# Patient Record
Sex: Female | Born: 2005 | Race: Black or African American | Hispanic: No | Marital: Single | Smoking: Never smoker
Health system: Southern US, Community
[De-identification: ages and names within clinical notes are randomized; demographics above are authoritative.]

## PROBLEM LIST (undated history)

## (undated) DIAGNOSIS — R569 Unspecified convulsions: Secondary | ICD-10-CM

## (undated) HISTORY — PX: EYE SURGERY: SHX253

---
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
## 2024-01-10 ENCOUNTER — Ambulatory Visit: Payer: Medicaid Other | Admitting: Nurse Practitioner

## 2024-02-06 ENCOUNTER — Ambulatory Visit (INDEPENDENT_AMBULATORY_CARE_PROVIDER_SITE_OTHER): Payer: Medicaid Other | Admitting: Certified Nurse Midwife

## 2024-02-06 ENCOUNTER — Encounter: Payer: Self-pay | Admitting: Certified Nurse Midwife

## 2024-02-06 ENCOUNTER — Other Ambulatory Visit (HOSPITAL_COMMUNITY)
Admission: RE | Admit: 2024-02-06 | Discharge: 2024-02-06 | Disposition: A | Discharge status: Home / Self Care | Source: Ambulatory Visit | Attending: Certified Nurse Midwife | Admitting: Certified Nurse Midwife

## 2024-02-06 VITALS — BP 104/66 | HR 80 | Ht 59.0 in | Wt 96.0 lb

## 2024-02-06 DIAGNOSIS — Z113 Encounter for screening for infections with a predominantly sexual mode of transmission: Secondary | ICD-10-CM

## 2024-02-06 DIAGNOSIS — Z7689 Persons encountering health services in other specified circumstances: Secondary | ICD-10-CM | POA: Diagnosis not present

## 2024-02-06 DIAGNOSIS — N941 Unspecified dyspareunia: Secondary | ICD-10-CM

## 2024-02-06 NOTE — Progress Notes (Signed)
 Stanford Breed, MD   Chief Complaint  Patient presents with   New Patient (Initial Visit)    HPI:      Tammy Cross is a 18 y.o. [id number] whose LMP was Patient's last menstrual period was 11/06/2023 (exact date)., presents today for painful intercourse with vaginal dryness. She has one female partner, they are monogamous. Nexplanon for contraception, placed in 2022.  She reports difficulty achieving orgasm, pain and abdominal pressure with penetration.  There are no active problems to display for this patient.   Past Surgical History:  Procedure Laterality Date   EYE SURGERY      History reviewed. No pertinent family history.  Social History   Socioeconomic History   Marital status: Single    Spouse name: Not on file   Number of children: Not on file   Years of education: Not on file   Highest education level: 12th grade  Occupational History   Not on file  Tobacco Use   Smoking status: Never   Smokeless tobacco: Never  Vaping Use   Vaping status: Never Used  Substance and Sexual Activity   Alcohol use: Not Currently   Drug use: Never   Sexual activity: Yes  Other Topics Concern   Not on file  Social History Narrative   Not on file   Social Drivers of Health   Financial Resource Strain: Low Risk  (01/10/2024)   Overall Financial Resource Strain (CARDIA)    Difficulty of Paying Living Expenses: Not hard at all  Food Insecurity: No Food Insecurity (01/10/2024)   Hunger Vital Sign    Worried About Running Out of Food in the Last Year: Never true    Ran Out of Food in the Last Year: Never true  Transportation Needs: No Transportation Needs (01/10/2024)   PRAPARE - Administrator, Civil Service (Medical): No    Lack of Transportation (Non-Medical): No  Physical Activity: Unknown (01/10/2024)   Exercise Vital Sign    Days of Exercise per Week: 0 days    Minutes of Exercise per Session: Not on file  Stress: Stress Concern Present (01/10/2024)    [employer] of Occupational Health - Occupational Stress Questionnaire    Feeling of Stress : Very much  Social Connections: Moderately Isolated (01/10/2024)   Social Connection and Isolation Panel [NHANES]    Frequency of Communication with Friends and Family: More than three times a week    Frequency of Social Gatherings with Friends and Family: Once a week    Attends Religious Services: More than 4 times per year    Active Member of [employer] or Organizations: No    Attends Engineer, structural: Not on file    Marital Status: Never married  Intimate Partner Violence: Not on file    Outpatient Medications Prior to Visit  Medication Sig Dispense Refill   etonogestrel (NEXPLANON) 68 MG IMPL implant Inject into the skin.     cyclobenzaprine (FLEXERIL) 5 MG tablet Take 1 tablet (5 mg total) by mouth 3 (three) times daily as needed for muscle spasms. 30 tablet 0   chlorhexidine (PERIDEX) 0.12 % solution Use as directed 15 mLs in the mouth or throat 2 (two) times daily. 120 mL 0   ibuprofen (ADVIL) 800 MG tablet Take 1 tablet (800 mg total) by mouth every 8 (eight) hours as needed. 30 tablet 0   No facility-administered medications prior to visit.      ROS:  Review of Systems  Constitutional:  Negative.   Respiratory: Negative.    Cardiovascular: Negative.   Genitourinary:  Positive for dyspareunia, pelvic pain, vaginal discharge and vaginal pain.     OBJECTIVE:   Vitals:  BP 104/66   Pulse 80   Ht 4\' 11"  (1.499 m)   Wt 96 lb (43.5 kg)   LMP 11/06/2023 (Exact Date)   BMI 19.39 kg/m   Physical Exam Constitutional:      Appearance: Normal appearance.  Cardiovascular:     Rate and Rhythm: Normal rate.  Pulmonary:     Effort: Pulmonary effort is normal.  Abdominal:     General: Abdomen is flat.     Palpations: Abdomen is soft.  Genitourinary:    General: Normal vulva.     Tanner stage (genital): 5.     Vagina: Vaginal discharge present.     Cervix: Normal.  No cervical motion tenderness.     Adnexa:        Right: No mass or tenderness.         Left: No mass or tenderness.       Comments: Cervix anterior, uterus retroverted to retroflexed. Neurological:     General: No focal deficit present.     Mental Status: She is alert and oriented to person, place, and time.  Psychiatric:        Mood and Affect: Mood normal.        Behavior: Behavior normal.    Results: No results found for this or any previous visit (from the past 24 hours).   Assessment/Plan: Encounter to establish care  Screening examination for STI - Plan: Cervicovaginal ancillary only  Dyspareunia in female  Discussed use of lubrication, different positions to manage depth with IC, reviewed that position of uterus may be impacting discomfort. Recommend removal of Nexplanon by or before 01/2026, STI prevention with condoms. Will notify via MyChart of results.  No orders of the defined types were placed in this encounter.    Dominica Severin, CNM 02/06/2024 12:20 PM

## 2024-02-07 LAB — CERVICOVAGINAL ANCILLARY ONLY
Bacterial Vaginitis (gardnerella): NEGATIVE
Candida Glabrata: NEGATIVE
Candida Vaginitis: POSITIVE — AB
Chlamydia: POSITIVE — AB
Comment: NEGATIVE
Comment: NEGATIVE
Comment: NEGATIVE
Comment: NEGATIVE
Comment: NEGATIVE
Comment: NORMAL
Neisseria Gonorrhea: NEGATIVE
Trichomonas: NEGATIVE

## 2024-02-08 ENCOUNTER — Other Ambulatory Visit: Payer: Self-pay | Admitting: Certified Nurse Midwife

## 2024-02-08 ENCOUNTER — Encounter: Payer: Self-pay | Admitting: Certified Nurse Midwife

## 2024-02-08 MED ORDER — DOXYCYCLINE HYCLATE 100 MG PO CAPS: 100.0000 mg | ORAL_CAPSULE | Freq: Two times a day (BID) | ORAL | 0 refills | Status: AC

## 2024-02-08 MED ORDER — FLUCONAZOLE 150 MG PO TABS: 150.0000 mg | ORAL_TABLET | ORAL | 0 refills | Status: AC

## 2024-02-28 ENCOUNTER — Ambulatory Visit: Payer: Medicaid Other | Admitting: Nurse Practitioner

## 2024-04-03 DIAGNOSIS — Z743 Need for continuous supervision: Secondary | ICD-10-CM | POA: Diagnosis not present

## 2024-04-03 DIAGNOSIS — R569 Unspecified convulsions: Secondary | ICD-10-CM | POA: Diagnosis not present

## 2024-04-03 DIAGNOSIS — G4089 Other seizures: Secondary | ICD-10-CM | POA: Diagnosis not present

## 2024-04-03 DIAGNOSIS — Z5982 Transportation insecurity: Secondary | ICD-10-CM | POA: Diagnosis not present

## 2024-04-04 DIAGNOSIS — R569 Unspecified convulsions: Secondary | ICD-10-CM | POA: Diagnosis not present

## 2024-04-04 DIAGNOSIS — E872 Acidosis, unspecified: Secondary | ICD-10-CM | POA: Diagnosis not present

## 2024-04-05 ENCOUNTER — Ambulatory Visit: Payer: Medicaid Other | Admitting: Nurse Practitioner

## 2024-04-05 DIAGNOSIS — R569 Unspecified convulsions: Secondary | ICD-10-CM | POA: Diagnosis not present

## 2024-05-12 ENCOUNTER — Telehealth: Admitting: Family Medicine

## 2024-05-12 DIAGNOSIS — N898 Other specified noninflammatory disorders of vagina: Secondary | ICD-10-CM

## 2024-05-12 DIAGNOSIS — N941 Unspecified dyspareunia: Secondary | ICD-10-CM

## 2024-05-12 NOTE — Progress Notes (Signed)
  Because Ms. Wanat, I feel your condition warrants further evaluation and I recommend that you be seen in a face-to-face visit.   NOTE: There will be NO CHARGE for this E-Visit   If you are having a true medical emergency, please call 911.     For an urgent face to face visit, [facility] has multiple urgent care centers for your convenience.  Click the link below for the full list of locations and hours, walk-in wait times, appointment scheduling options and driving directions:  Urgent Care - [place], [place], [place], [place], [place], [place]  [place]     Your MyChart E-visit questionnaire answers were reviewed by a board certified advanced clinical practitioner to complete your personal care plan based on your specific symptoms.    Thank you for using e-Visits.   have provided 5 minutes of non face to face time during this encounter for chart review and documentation.

## 2024-05-14 ENCOUNTER — Ambulatory Visit: Payer: Self-pay

## 2024-05-17 ENCOUNTER — Ambulatory Visit

## 2024-07-23 ENCOUNTER — Emergency Department

## 2024-07-23 ENCOUNTER — Emergency Department
Admission: EM | Admit: 2024-07-23 | Discharge: 2024-07-23 | Disposition: A | Discharge status: Home / Self Care | Attending: Emergency Medicine | Admitting: Emergency Medicine

## 2024-07-23 ENCOUNTER — Other Ambulatory Visit: Payer: Self-pay

## 2024-07-23 DIAGNOSIS — W231XXA Caught, crushed, jammed, or pinched between stationary objects, initial encounter: Secondary | ICD-10-CM | POA: Insufficient documentation

## 2024-07-23 DIAGNOSIS — Y92810 Car as the place of occurrence of the external cause: Secondary | ICD-10-CM | POA: Diagnosis not present

## 2024-07-23 DIAGNOSIS — S6010XA Contusion of unspecified finger with damage to nail, initial encounter: Secondary | ICD-10-CM

## 2024-07-23 DIAGNOSIS — S60111A Contusion of right thumb with damage to nail, initial encounter: Secondary | ICD-10-CM | POA: Insufficient documentation

## 2024-07-23 DIAGNOSIS — S6991XA Unspecified injury of right wrist, hand and finger(s), initial encounter: Secondary | ICD-10-CM | POA: Diagnosis not present
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
## 2024-07-23 NOTE — ED Provider Notes (Signed)
 [facility] Provider Note    Event Date/Time   First MD Initiated Contact with Patient 07/23/24 1107     (approximate)   History   Finger Injury   HPI  Tammy Cross is a 18 y.o. female who presents today for evaluation of injury to her right thumb.  Patient reports that 2 days ago she slammed her thumb in the car door and has had pain ever since.  She denies any open wounds or bleeding.  No numbness or tingling.  She is still able to move her thumb normally  There are no active problems to display for this patient.         Physical Exam   Triage Vital Signs: ED Triage Vitals  Encounter Vitals Group     BP 07/23/24 1103 (!) 124/58     Girls Systolic BP Percentile --      Girls Diastolic BP Percentile --      Boys Systolic BP Percentile --      Boys Diastolic BP Percentile --      Pulse Rate 07/23/24 1103 60     Resp 07/23/24 1103 18     Temp 07/23/24 1103 98 F (36.7 C)     Temp Source 07/23/24 1103 Oral     SpO2 07/23/24 1103 98 %     Weight 07/23/24 1101 95 lb (43.1 kg)     Height 07/23/24 1101 4' 11 (1.499 m)     Head Circumference --      Peak Flow --      Pain Score 07/23/24 1101 10     Pain Loc --      Pain Education --      Exclude from Growth Chart --     Most recent vital signs: Vitals:   07/23/24 1103 07/23/24 1134  BP: (!) 124/58 122/60  Pulse: 60 62  Resp: 18 18  Temp: 98 F (36.7 C) 98 F (36.7 C)  SpO2: 98% 98%    Physical Exam Vitals and nursing note reviewed.  Constitutional:      General: Awake and alert. No acute distress.    Appearance: Normal appearance. The patient is normal weight.  HENT:     Head: Normocephalic and atraumatic.     Mouth: Mucous membranes are moist.  Eyes:     General: PERRL. Normal EOMs        Right eye: No discharge.        Left eye: No discharge.     Conjunctiva/sclera: Conjunctivae normal.  Cardiovascular:     Rate and Rhythm: Normal rate and regular rhythm.     Pulses:  Normal pulses.  Pulmonary:     Effort: Pulmonary effort is normal. No respiratory distress.     Breath sounds: Normal breath sounds.  Abdominal:     Abdomen is soft. There is no abdominal tenderness. No rebound or guarding. No distention. Musculoskeletal:        General: No swelling. Normal range of motion.     Cervical back: Normal range of motion and neck supple.  Right thumb with subungual hematoma noted extending from base of nail to mid nail.  There is no swelling to the rest of the thumb or pad of the thumb.  She has normal range of motion of her thumb including abduction and adduction against resistance, and flexion and extension at isolated MCP and DIP.  No open wounds. Skin:    General: Skin is warm and dry.  Capillary Refill: Capillary refill takes less than 2 seconds.     Findings: No rash.  Neurological:     Mental Status: The patient is awake and alert.      ED Results / Procedures / Treatments   Labs (all labs ordered are listed, but only abnormal results are displayed) Labs Reviewed - No data to display   EKG     RADIOLOGY I independently reviewed and interpreted imaging and agree with radiologists findings.     PROCEDURES:  Critical Care performed:   .Incision and Drainage  Date/Time: 07/23/2024 2:52 PM  Performed by: Andreyah Natividad E, PA-C Authorized by: Nataya Bastedo E, PA-C   Consent:    Consent obtained:  Verbal   Consent given by:  Patient   Risks, benefits, and alternatives were discussed: yes     Risks discussed:  Bleeding, damage to other organs, incomplete drainage, infection and pain (Hole in the nail)   Alternatives discussed:  No treatment Universal protocol:    Procedure explained and questions answered to patient or proxy's satisfaction: yes     Relevant documents present and verified: yes     Test results available : yes     Imaging studies available: yes     Required blood products, implants, devices, and special equipment  available: yes     Site/side marked: yes     Immediately prior to procedure, a time out was called: yes     Patient identity confirmed:  Verbally with patient Location:    Type:  Subungual hematoma   Size:  0.5   Location:  Upper extremity   Upper extremity location:  Finger   Finger location:  R thumb Pre-procedure details:    Skin preparation:  Antiseptic wash Sedation:    Sedation type:  None Anesthesia:    Anesthesia method:  None Procedure type:    Complexity:  Simple Procedure details:    Ultrasound guidance: no     Needle aspiration: no     Incision types:  Stab incision (Trephination with cautery pen)   Incision depth:  Subungual   Drainage:  Bloody   Drainage amount:  Moderate   Wound treatment:  Wound left open   Packing materials:  None Post-procedure details:    Procedure completion:  Tolerated well, no immediate complications    MEDICATIONS ORDERED IN ED: Medications - No data to display   IMPRESSION / MDM / ASSESSMENT AND PLAN / ED COURSE  I reviewed the triage vital signs and the nursing notes.   Differential diagnosis includes, but is not limited to, subungual hematoma, fracture, less likely paronychia  Patient is awake and alert, hemodynamically stable and afebrile.  She has obvious subungual hematoma.  X-ray obtained reveals no acute fracture or dislocation.  I discussed the option of trephination to relieve the pressure which is likely contributing to her pain, and patient would like to proceed with this.  Trephination was performed with moderate amount of blood released and significant improvement of her pain.  We discussed wound care and return precautions.  Patient understands and agrees with plan.  She was discharged in stable condition.   Patient's presentation is most consistent with acute complicated illness / injury requiring diagnostic workup.    FINAL CLINICAL IMPRESSION(S) / ED DIAGNOSES   Final diagnoses:  Subungual hematoma of digit  of hand, initial encounter     Rx / DC Orders   ED Discharge Orders     None  Note:  This document was prepared using Dragon voice recognition software and may include unintentional dictation errors.   Kaylene Dawn E, PA-C 07/23/24 1456    Arlander Charleston, MD 07/23/24 [phone]

## 2024-07-23 NOTE — Discharge Instructions (Signed)
 Your subungual hematoma was drained. Keep the area clean and dry. Return for new, worsening, or change in symptoms or other concerns. It was a pleasure caring for you.

## 2024-08-21 ENCOUNTER — Other Ambulatory Visit: Payer: Self-pay

## 2024-08-21 ENCOUNTER — Emergency Department
Admission: EM | Admit: 2024-08-21 | Discharge: 2024-08-21 | Disposition: A | Discharge status: Home / Self Care | Attending: Emergency Medicine | Admitting: Emergency Medicine

## 2024-08-21 DIAGNOSIS — R112 Nausea with vomiting, unspecified: Secondary | ICD-10-CM | POA: Diagnosis present

## 2024-08-21 DIAGNOSIS — R109 Unspecified abdominal pain: Secondary | ICD-10-CM | POA: Insufficient documentation

## 2024-08-21 LAB — RESP PANEL BY RT-PCR (RSV, FLU A&B, COVID)  RVPGX2
Influenza A by PCR: NEGATIVE
Influenza B by PCR: NEGATIVE
Resp Syncytial Virus by PCR: NEGATIVE
SARS Coronavirus 2 by RT PCR: NEGATIVE

## 2024-08-21 LAB — URINALYSIS, ROUTINE W REFLEX MICROSCOPIC
Bilirubin Urine: NEGATIVE
Glucose, UA: NEGATIVE mg/dL
Ketones, ur: 80 mg/dL — AB
Leukocytes,Ua: NEGATIVE
Nitrite: NEGATIVE
Protein, ur: >=300 mg/dL — AB
Specific Gravity, Urine: 1.016 (ref 1.005–1.030)
pH: 5 (ref 5.0–8.0)

## 2024-08-21 LAB — CBC
HCT: 35 % — ABNORMAL LOW (ref 36.0–46.0)
Hemoglobin: 11.3 g/dL — ABNORMAL LOW (ref 12.0–15.0)
MCH: 27.8 pg (ref 26.0–34.0)
MCHC: 32.3 g/dL (ref 30.0–36.0)
MCV: 86 fL (ref 80.0–100.0)
Platelets: 207 K/uL (ref 150–400)
RBC: 4.07 MIL/uL (ref 3.87–5.11)
RDW: 12.8 % (ref 11.5–15.5)
WBC: 15.1 K/uL — ABNORMAL HIGH (ref 4.0–10.5)
nRBC: 0 % (ref 0.0–0.2)

## 2024-08-21 LAB — COMPREHENSIVE METABOLIC PANEL WITH GFR
ALT: 13 U/L (ref 0–44)
AST: 26 U/L (ref 15–41)
Albumin: 4.5 g/dL (ref 3.5–5.0)
Alkaline Phosphatase: 51 U/L (ref 38–126)
Anion gap: 13 (ref 5–15)
BUN: 14 mg/dL (ref 6–20)
CO2: 17 mmol/L — ABNORMAL LOW (ref 22–32)
Calcium: 9.1 mg/dL (ref 8.9–10.3)
Chloride: 109 mmol/L (ref 98–111)
Creatinine, Ser: 0.96 mg/dL (ref 0.44–1.00)
GFR, Estimated: >60 mL/min (ref >60)
Glucose, Bld: 90 mg/dL (ref 70–99)
Potassium: 3.9 mmol/L (ref 3.5–5.1)
Sodium: 139 mmol/L (ref 135–145)
Total Bilirubin: 1.5 mg/dL — ABNORMAL HIGH (ref 0.0–1.2)
Total Protein: 7.5 g/dL (ref 6.5–8.1)

## 2024-08-21 LAB — PREGNANCY, URINE: Preg Test, Ur: NEGATIVE

## 2024-08-21 LAB — GROUP A STREP BY PCR: Group A Strep by PCR: NOT DETECTED

## 2024-08-21 LAB — LIPASE, BLOOD: Lipase: 11 U/L (ref 11–51)

## 2024-08-21 MED ORDER — ONDANSETRON HCL 4 MG/2ML IJ SOLN
4.0000 mg | Freq: Once | INTRAMUSCULAR | Status: AC
  Administered 2024-08-21: 4 mg via INTRAVENOUS
  Filled 2024-08-21: qty 2

## 2024-08-21 MED ORDER — PANTOPRAZOLE SODIUM 40 MG IV SOLR
40.0000 mg | Freq: Once | INTRAVENOUS | Status: AC
  Administered 2024-08-21: 40 mg via INTRAVENOUS
  Filled 2024-08-21: qty 10

## 2024-08-21 MED ORDER — SODIUM CHLORIDE 0.9 % IV BOLUS
1000.0000 mL | Freq: Once | INTRAVENOUS | Status: AC
  Administered 2024-08-21: 1000 mL via INTRAVENOUS

## 2024-08-21 MED ORDER — METOCLOPRAMIDE HCL 10 MG PO TABS
10.0000 mg | ORAL_TABLET | Freq: Three times a day (TID) | ORAL | 1 refills | Status: AC | PRN
Start: 2024-08-21 — End: 2024-09-20

## 2024-08-21 MED ORDER — METOCLOPRAMIDE HCL 5 MG/ML IJ SOLN
10.0000 mg | Freq: Once | INTRAMUSCULAR | Status: AC
  Administered 2024-08-21: 10 mg via INTRAVENOUS
  Filled 2024-08-21: qty 2

## 2024-08-21 MED ORDER — ACETAMINOPHEN 325 MG PO TABS
650.0000 mg | ORAL_TABLET | Freq: Once | ORAL | Status: AC
  Administered 2024-08-21: 650 mg via ORAL
  Filled 2024-08-21: qty 2

## 2024-08-21 NOTE — ED Notes (Signed)
 Pt given PO trial with this RN at bedside. Pt has no complaints of nausea or vomiting at this time. Pt is complaining of headache.

## 2024-08-21 NOTE — Discharge Instructions (Addendum)
 Your testing today was fortunately reassuring. I have sent a prescription for nausea medication to your pharmacy that you can take as needed. Follow-up with your PCP for further evaluation. Return to the ER for new or worsening symptoms including inability to tolerate food or liquids despite medication, worsening abdominal pain, or any other new or concerning symptoms.

## 2024-08-21 NOTE — ED Provider Notes (Signed)
 [facility] Provider Note    Event Date/Time   First MD Initiated Contact with Patient 08/21/24 1745     (approximate)   History   Abdominal Pain, Nausea, and Emesis   HPI  Tammy Cross is a 18 year old female presenting to the emergency department for evaluation of vomiting.  Patient reports that this morning she had onset of nausea with multiple episodes of vomiting.  Initially nonbloody, did later notice signs of bloody streaks.  No diarrhea.  She had some abdominal pain when she is vomiting, currently denies any significant abdominal pain.  No known sick contacts.    Physical Exam   Triage Vital Signs: ED Triage Vitals  Encounter Vitals Group     BP 08/21/24 1623 (!) 122/53     Girls Systolic BP Percentile --      Girls Diastolic BP Percentile --      Boys Systolic BP Percentile --      Boys Diastolic BP Percentile --      Pulse Rate 08/21/24 1623 93     Resp 08/21/24 1623 17     Temp 08/21/24 1623 98.1 F (36.7 C)     Temp Source 08/21/24 1623 Oral     SpO2 08/21/24 1620 100 %     Weight 08/21/24 1624 89 lb (40.4 kg)     Height --      Head Circumference --      Peak Flow --      Pain Score 08/21/24 1624 7     Pain Loc --      Pain Education --      Exclude from Growth Chart --     Most recent vital signs: Vitals:   08/21/24 1623 08/21/24 2042  BP: (!) 122/53 (!) 103/50  Pulse: 93 96  Resp: 17 16  Temp: 98.1 F (36.7 C) 98.4 F (36.9 C)  SpO2: 100% 100%     General: Awake, interactive  CV:  Regular rate, good peripheral perfusion.  Resp:  Unlabored respirations Abd:  Nondistended, soft, no significant tenderness palpation Neuro:  Symmetric facial movement, fluid speech, moving extremity spontaneously and equally   ED Results / Procedures / Treatments   Labs (all labs ordered are listed, but only abnormal results are displayed) Labs Reviewed  COMPREHENSIVE METABOLIC PANEL WITH GFR - Abnormal; Notable for the following  components:      Result Value   CO2 17 (*)    Total Bilirubin 1.5 (*)    All other components within normal limits  CBC - Abnormal; Notable for the following components:   WBC 15.1 (*)    Hemoglobin 11.3 (*)    HCT 35.0 (*)    All other components within normal limits  URINALYSIS, ROUTINE W REFLEX MICROSCOPIC - Abnormal; Notable for the following components:   Color, Urine YELLOW (*)    APPearance CLOUDY (*)    Hgb urine dipstick SMALL (*)    Ketones, ur 80 (*)    Protein, ur >=300 (*)    Bacteria, UA RARE (*)    All other components within normal limits  RESP PANEL BY RT-PCR (RSV, FLU A&B, COVID)  RVPGX2  GROUP A STREP BY PCR  URINE CULTURE  PREGNANCY, URINE  LIPASE, BLOOD  POC URINE PREG, ED     EKG EKG independently reviewed and interpreted by myself demonstrates:    RADIOLOGY Imaging independently reviewed and interpreted by myself demonstrates:   Formal Radiology Read:  No results found.  PROCEDURES:  Critical Care performed: No  Procedures   MEDICATIONS ORDERED IN ED: Medications  sodium chloride  0.9 % bolus 1,000 mL (0 mLs Intravenous Stopped 08/21/24 1922)  ondansetron  (ZOFRAN ) injection 4 mg (4 mg Intravenous Given 08/21/24 1835)  pantoprazole (PROTONIX) injection 40 mg (40 mg Intravenous Given 08/21/24 1835)  acetaminophen  (TYLENOL ) tablet 650 mg (650 mg Oral Given 08/21/24 1938)  metoCLOPramide (REGLAN) injection 10 mg (10 mg Intravenous Given 08/21/24 2008)     IMPRESSION / MDM / ASSESSMENT AND PLAN / ED COURSE  I reviewed the triage vital signs and the nursing notes.  Differential diagnosis includes, but is not limited to, viral illness, dehydration, anemia, electrolyte abnormality, lower suspicion acute intra-abdominal process given reassuring abdominal exam, suspect likely Mallory-Weiss tear given small amount of hematemesis, no chest pain, shortness of breath, ill appearance suggestive of esophageal rupture or other emergent process  Patient's  presentation is most consistent with acute presentation with potential threat to life or bodily function.  18 year old female presenting to the emergency department for evaluation of vomiting and abdominal pain.  Reassuring abdominal exam.  Lab stable on presentation.  CBC with leukocytosis WC 15.1.  CMP with mild acidosis likely related to dehydration.  Urinalysis equivocal for infection with 21-50 white blood cells, rare bacteria but no nitrates or leukocyte esterase.  Patient denying any urinary symptoms, will hold off on empiric treatment.  Urinalysis is notable for ketones reflective of dehydration.  Patient was given IV fluids, Zofran , Protonix here.  Did have some ongoing nausea given Reglan and Tylenol .  On reevaluation does report improved symptoms.  Was able to tolerate p.o.  She is comfortable discharge home.  Strict return precautions provided.  Patient discharged in stable condition.      FINAL CLINICAL IMPRESSION(S) / ED DIAGNOSES   Final diagnoses:  Nausea and vomiting, unspecified vomiting type     Rx / DC Orders   ED Discharge Orders          Ordered    metoCLOPramide (REGLAN) 10 MG tablet  Every 8 hours PRN        08/21/24 2112             Note:  This document was prepared using Dragon voice recognition software and may include unintentional dictation errors.   Levander Slate, MD 08/21/24 2112

## 2024-08-21 NOTE — ED Triage Notes (Signed)
 PT arrives via EMS from home. Pt c/o generalized abdominal pain, nausea, and vomiting since this morning. Pt states she noticed small amount of blood in her vomit. Pt is AxOx4.

## 2024-08-21 NOTE — ED Notes (Signed)
 Pt given DC instructions. Pt verbalized understanding of medication and follow up care. Pt ambulatory from ED without difficulty.

## 2024-08-21 NOTE — ED Provider Triage Note (Signed)
 Emergency Medicine Provider Triage Evaluation Note  Tammy Cross , a 18 y.o. female  was evaluated in triage.  Pt complains of abdominal pain with nausea, vomiting containing blood streaks, and sore throat today. No fever.   Physical Exam  BP (!) 122/53   Pulse 93   Temp 98.1 F (36.7 C) (Oral)   Resp 17   Wt 40.4 kg   LMP 07/20/2024   SpO2 100%   BMI 17.98 kg/m  Gen:   Awake, no distress   Resp:  Normal effort  MSK:   Moves extremities without difficulty  Other:    Medical Decision Making  Medically screening exam initiated at 4:25 PM.  Appropriate orders placed.  Teeghan Hammer was informed that the remainder of the evaluation will be completed by another provider, this initial triage assessment does not replace that evaluation, and the importance of remaining in the ED until their evaluation is complete.  Work up started. Appropriate for waiting room.   Herlinda Kirk NOVAK, FNP 08/21/24 2007

## 2024-08-22 ENCOUNTER — Encounter: Payer: Self-pay | Admitting: Internal Medicine

## 2024-08-22 ENCOUNTER — Observation Stay

## 2024-08-22 ENCOUNTER — Observation Stay
Admission: EM | Admit: 2024-08-22 | Discharge: 2024-08-23 | Disposition: A | Discharge status: Home / Self Care | Attending: Internal Medicine | Admitting: Internal Medicine

## 2024-08-22 ENCOUNTER — Emergency Department

## 2024-08-22 DIAGNOSIS — F1210 Cannabis abuse, uncomplicated: Secondary | ICD-10-CM | POA: Diagnosis not present

## 2024-08-22 DIAGNOSIS — Z91148 Patient's other noncompliance with medication regimen for other reason: Secondary | ICD-10-CM | POA: Diagnosis not present

## 2024-08-22 DIAGNOSIS — D72829 Elevated white blood cell count, unspecified: Secondary | ICD-10-CM | POA: Insufficient documentation

## 2024-08-22 DIAGNOSIS — R569 Unspecified convulsions: Principal | ICD-10-CM

## 2024-08-22 DIAGNOSIS — E860 Dehydration: Secondary | ICD-10-CM | POA: Diagnosis not present

## 2024-08-22 DIAGNOSIS — N390 Urinary tract infection, site not specified: Secondary | ICD-10-CM | POA: Diagnosis not present

## 2024-08-22 DIAGNOSIS — G40909 Epilepsy, unspecified, not intractable, without status epilepticus: Secondary | ICD-10-CM | POA: Diagnosis not present

## 2024-08-22 DIAGNOSIS — F1290 Cannabis use, unspecified, uncomplicated: Secondary | ICD-10-CM | POA: Diagnosis not present

## 2024-08-22 DIAGNOSIS — Z23 Encounter for immunization: Secondary | ICD-10-CM | POA: Diagnosis not present

## 2024-08-22 DIAGNOSIS — D696 Thrombocytopenia, unspecified: Secondary | ICD-10-CM | POA: Insufficient documentation

## 2024-08-22 DIAGNOSIS — R112 Nausea with vomiting, unspecified: Secondary | ICD-10-CM | POA: Diagnosis not present

## 2024-08-22 LAB — URINE DRUG SCREEN, QUALITATIVE (ARMC ONLY)
Amphetamines, Ur Screen: NOT DETECTED
Barbiturates, Ur Screen: NOT DETECTED
Benzodiazepine, Ur Scrn: NOT DETECTED
Cannabinoid 50 Ng, Ur ~~LOC~~: POSITIVE — AB
Cocaine Metabolite,Ur ~~LOC~~: NOT DETECTED
MDMA (Ecstasy)Ur Screen: NOT DETECTED
Methadone Scn, Ur: NOT DETECTED
Opiate, Ur Screen: NOT DETECTED
Phencyclidine (PCP) Ur S: NOT DETECTED
Tricyclic, Ur Screen: NOT DETECTED

## 2024-08-22 LAB — URINE CULTURE: Culture: NO GROWTH

## 2024-08-22 LAB — CBC WITH DIFFERENTIAL/PLATELET
Abs Immature Granulocytes: 0.09 K/uL — ABNORMAL HIGH (ref 0.00–0.07)
Basophils Absolute: 0 K/uL (ref 0.0–0.1)
Basophils Relative: 0 %
Eosinophils Absolute: 0 K/uL (ref 0.0–0.5)
Eosinophils Relative: 0 %
HCT: 35.2 % — ABNORMAL LOW (ref 36.0–46.0)
Hemoglobin: 11.5 g/dL — ABNORMAL LOW (ref 12.0–15.0)
Immature Granulocytes: 1 %
Lymphocytes Relative: 9 %
Lymphs Abs: 1.4 K/uL (ref 0.7–4.0)
MCH: 27.8 pg (ref 26.0–34.0)
MCHC: 32.7 g/dL (ref 30.0–36.0)
MCV: 85 fL (ref 80.0–100.0)
Monocytes Absolute: 1 K/uL (ref 0.1–1.0)
Monocytes Relative: 6 %
Neutro Abs: 13.4 K/uL — ABNORMAL HIGH (ref 1.7–7.7)
Neutrophils Relative %: 84 %
Platelets: 195 K/uL (ref 150–400)
RBC: 4.14 MIL/uL (ref 3.87–5.11)
RDW: 12.8 % (ref 11.5–15.5)
WBC: 15.9 K/uL — ABNORMAL HIGH (ref 4.0–10.5)
nRBC: 0 % (ref 0.0–0.2)

## 2024-08-22 LAB — COMPREHENSIVE METABOLIC PANEL WITH GFR
ALT: 14 U/L (ref 0–44)
AST: 33 U/L (ref 15–41)
Albumin: 4.4 g/dL (ref 3.5–5.0)
Alkaline Phosphatase: 44 U/L (ref 38–126)
Anion gap: 12 (ref 5–15)
BUN: 9 mg/dL (ref 6–20)
CO2: 20 mmol/L — ABNORMAL LOW (ref 22–32)
Calcium: 9.5 mg/dL (ref 8.9–10.3)
Chloride: 107 mmol/L (ref 98–111)
Creatinine, Ser: 0.81 mg/dL (ref 0.44–1.00)
GFR, Estimated: >60 mL/min (ref >60)
Glucose, Bld: 116 mg/dL — ABNORMAL HIGH (ref 70–99)
Potassium: 3.8 mmol/L (ref 3.5–5.1)
Sodium: 139 mmol/L (ref 135–145)
Total Bilirubin: 1.6 mg/dL — ABNORMAL HIGH (ref 0.0–1.2)
Total Protein: 7.6 g/dL (ref 6.5–8.1)

## 2024-08-22 LAB — SEDIMENTATION RATE: Sed Rate: 5 mm/h (ref 0–20)

## 2024-08-22 LAB — C-REACTIVE PROTEIN: CRP: 0.8 mg/dL (ref <1.0)

## 2024-08-22 LAB — ETHANOL: Alcohol, Ethyl (B): <15 mg/dL (ref <15)

## 2024-08-22 LAB — CBG MONITORING, ED: Glucose-Capillary: 108 mg/dL — ABNORMAL HIGH (ref 70–99)

## 2024-08-22 MED ORDER — ORAL CARE MOUTH RINSE: 15.0000 mL | OROMUCOSAL | Status: DC | PRN

## 2024-08-22 MED ORDER — CARMEX CLASSIC LIP BALM EX OINT
TOPICAL_OINTMENT | CUTANEOUS | Status: DC | PRN
  Administered 2024-08-22: 1 via TOPICAL
  Filled 2024-08-22: qty 10

## 2024-08-22 MED ORDER — IOHEXOL 300 MG/ML  SOLN
100.0000 mL | Freq: Once | INTRAMUSCULAR | Status: AC | PRN
  Administered 2024-08-22: 100 mL via INTRAVENOUS

## 2024-08-22 MED ORDER — SODIUM CHLORIDE 0.9 % IV SOLN
2.0000 g | Freq: Once | INTRAVENOUS | Status: AC
  Administered 2024-08-22: 2 g via INTRAVENOUS
  Filled 2024-08-22: qty 20

## 2024-08-22 MED ORDER — ACETAMINOPHEN 325 MG PO TABS: 650.0000 mg | ORAL_TABLET | Freq: Four times a day (QID) | ORAL | Status: DC | PRN

## 2024-08-22 MED ORDER — SODIUM CHLORIDE 0.9 % IV SOLN: 75.0000 mL/h | INTRAVENOUS | Status: DC

## 2024-08-22 MED ORDER — GADOBUTROL 1 MMOL/ML IV SOLN
5.0000 mL | Freq: Once | INTRAVENOUS | Status: AC | PRN
  Administered 2024-08-22: 5 mL via INTRAVENOUS

## 2024-08-22 MED ORDER — DICYCLOMINE HCL 10 MG PO CAPS: 10.0000 mg | ORAL_CAPSULE | Freq: Three times a day (TID) | ORAL | Status: DC | PRN

## 2024-08-22 MED ORDER — ONDANSETRON HCL 4 MG PO TABS: 4.0000 mg | ORAL_TABLET | Freq: Four times a day (QID) | ORAL | Status: DC | PRN

## 2024-08-22 MED ORDER — SODIUM CHLORIDE 0.9 % IV SOLN
75.0000 mL/h | INTRAVENOUS | Status: DC
  Administered 2024-08-22: 75 mL/h via INTRAVENOUS

## 2024-08-22 MED ORDER — ACETAMINOPHEN 650 MG RE SUPP: 650.0000 mg | RECTAL | Status: DC | PRN

## 2024-08-22 MED ORDER — CEFTRIAXONE SODIUM 1 G IJ SOLR
1.0000 g | INTRAMUSCULAR | Status: DC
  Administered 2024-08-23: 1 g via INTRAVENOUS
  Filled 2024-08-22: qty 10

## 2024-08-22 MED ORDER — ACETAMINOPHEN 325 MG PO TABS: 650.0000 mg | ORAL_TABLET | ORAL | Status: DC | PRN

## 2024-08-22 MED ORDER — ORAL CARE MOUTH RINSE
15.0000 mL | OROMUCOSAL | Status: DC
  Administered 2024-08-22 (×5): 15 mL via OROMUCOSAL
  Filled 2024-08-22 (×8): qty 15

## 2024-08-22 MED ORDER — SODIUM CHLORIDE 0.9 % IV BOLUS (SEPSIS)
1000.0000 mL | Freq: Once | INTRAVENOUS | Status: AC
  Administered 2024-08-22: 1000 mL via INTRAVENOUS

## 2024-08-22 MED ORDER — LEVETIRACETAM (KEPPRA) 500 MG/5 ML ADULT IV PUSH
500.0000 mg | Freq: Two times a day (BID) | INTRAVENOUS | Status: DC
  Administered 2024-08-22 – 2024-08-23 (×3): 500 mg via INTRAVENOUS
  Filled 2024-08-22 (×3): qty 5

## 2024-08-22 MED ORDER — LORAZEPAM 2 MG/ML IJ SOLN: 1.0000 mg | INTRAMUSCULAR | Status: DC | PRN

## 2024-08-22 MED ORDER — ONDANSETRON HCL 4 MG/2ML IJ SOLN: 4.0000 mg | Freq: Four times a day (QID) | INTRAMUSCULAR | Status: DC | PRN

## 2024-08-22 MED ORDER — LEVETIRACETAM (KEPPRA) 500 MG/5 ML ADULT IV PUSH
1000.0000 mg | Freq: Once | INTRAVENOUS | Status: AC
  Administered 2024-08-22: 1000 mg via INTRAVENOUS
  Filled 2024-08-22: qty 10

## 2024-08-22 MED ORDER — INFLUENZA VIRUS VACC SPLIT PF (FLUZONE) 0.5 ML IM SUSY
0.5000 mL | PREFILLED_SYRINGE | INTRAMUSCULAR | Status: AC
  Administered 2024-08-23: 0.5 mL via INTRAMUSCULAR
  Filled 2024-08-22: qty 0.5

## 2024-08-22 MED ORDER — ORAL CARE MOUTH RINSE
15.0000 mL | OROMUCOSAL | Status: DC
  Administered 2024-08-22 – 2024-08-23 (×11): 15 mL via OROMUCOSAL
  Filled 2024-08-22 (×7): qty 15

## 2024-08-22 MED ORDER — PANTOPRAZOLE SODIUM 40 MG IV SOLR
40.0000 mg | INTRAVENOUS | Status: DC
  Administered 2024-08-22 – 2024-08-23 (×2): 40 mg via INTRAVENOUS
  Filled 2024-08-22 (×2): qty 10

## 2024-08-22 MED ORDER — ENOXAPARIN SODIUM 40 MG/0.4ML IJ SOSY
40.0000 mg | PREFILLED_SYRINGE | INTRAMUSCULAR | Status: DC
  Administered 2024-08-23: 40 mg via SUBCUTANEOUS
  Filled 2024-08-22 (×2): qty 0.4

## 2024-08-22 NOTE — ED Provider Notes (Addendum)
 [facility] Provider Note    Event Date/Time   First MD Initiated Contact with Patient 08/22/24 [phone]     (approximate)   History   Seizures   HPI  Tammy Cross is a 18 y.o. female with history of prior seizure 2 years ago not on antiepileptics who presents with EMS for seizure-like activity at home that was witnessed by family.  Patient postictal, uncooperative with EMS.  She did bite her tongue and lower lip.  No incontinence.  She has no complaints of pain at this time but was recently seen in the emergency department for vomiting, hematemesis.  Urine showed bacteria, pyuria given patient was not symptomatic, no antibiotics given.  Grandmother at bedside states seizure-like activity lasted for approximately 2 minutes.  She reports it happened while patient was sleeping in the bed next to her.   History provided by patient, EMS.    No past medical history on file.  Past Surgical History:  Procedure Laterality Date   EYE SURGERY      MEDICATIONS:  Prior to Admission medications   Medication Sig Start Date End Date Taking? Authorizing Provider  cyclobenzaprine  (FLEXERIL ) 5 MG tablet Take 1 tablet (5 mg total) by mouth 3 (three) times daily as needed for muscle spasms. 07/10/23   Margrette, Myah A, PA-C  etonogestrel  (NEXPLANON ) 68 MG IMPL implant Inject into the skin. 02/16/21   [provider]  metoCLOPramide (REGLAN) 10 MG tablet Take 1 tablet (10 mg total) by mouth every 8 (eight) hours as needed for nausea. 08/21/24 09/20/24  Levander Slate, MD    Physical Exam   Triage Vital Signs: ED Triage Vitals  Encounter Vitals Group     BP      Girls Systolic BP Percentile      Girls Diastolic BP Percentile      Boys Systolic BP Percentile      Boys Diastolic BP Percentile      Pulse      Resp      Temp      Temp src      SpO2      Weight      Height      Head Circumference      Peak Flow      Pain Score      Pain Loc      Pain Education       Exclude from Growth Chart     Most recent vital signs: Vitals:   08/22/24 0225 08/22/24 0229  BP:  (!) 115/91  Pulse: 71   Resp: 18   Temp: 97.6 F (36.4 C)   SpO2: 100%     CONSTITUTIONAL: Alert, confused, postictal HEAD: Normocephalic, atraumatic EYES: Conjunctivae clear, pupils appear equal, sclera nonicteric ENT: normal nose; moist mucous membranes, superficial abrasions noted to her tongue and lower lip, no dental injury, airway patent NECK: Supple, normal ROM, no midline spinal tenderness or step-off or deformity CARD: RRR; S1 and S2 appreciated RESP: Normal chest excursion without splinting or tachypnea; breath sounds clear and equal bilaterally; no wheezes, no rhonchi, no rales, no hypoxia or respiratory distress, speaking full sentences ABD/GI: Non-distended; soft, non-tender, no rebound, no guarding, no peritoneal signs BACK: The back appears normal EXT: Normal ROM in all joints; no deformity noted, no edema SKIN: Normal color for age and race; warm; no rash on exposed skin NEURO: Moves all extremities equally, normal speech, postictal PSYCH: The patient's mood and manner are appropriate.  ED Results / Procedures / Treatments   LABS: (all labs ordered are listed, but only abnormal results are displayed) Labs Reviewed  CBC WITH DIFFERENTIAL/PLATELET - Abnormal; Notable for the following components:      Result Value   WBC 15.9 (*)    Hemoglobin 11.5 (*)    HCT 35.2 (*)    Neutro Abs 13.4 (*)    Abs Immature Granulocytes 0.09 (*)    All other components within normal limits  COMPREHENSIVE METABOLIC PANEL WITH GFR - Abnormal; Notable for the following components:   CO2 20 (*)    Glucose, Bld 116 (*)    Total Bilirubin 1.6 (*)    All other components within normal limits  CBG MONITORING, ED - Abnormal; Notable for the following components:   Glucose-Capillary 108 (*)    All other components within normal limits  URINE CULTURE  ETHANOL  URINE DRUG  SCREEN, QUALITATIVE ([facility] ONLY)     EKG:  EKG Interpretation Date/Time:  Wednesday August 22 2024 02:26:55 EDT Ventricular Rate:  69 PR Interval:  132 QRS Duration:  80 QT Interval:  388 QTC Calculation: 416 R Axis:   78  Text Interpretation: Sinus rhythm Atrial premature complexes in couplets LVH by voltage ST elev, probable normal early repol pattern Baseline wander in lead(s) II Confirmed by Neomi Neptune [phone]) on 08/22/2024 2:35:09 AM         RADIOLOGY: My personal review and interpretation of imaging: CT abdomen pelvis, CT head showed no acute abnormality.  I have personally reviewed all radiology reports.   CT HEAD WO CONTRAST ( ) Result Date: 08/22/2024 CLINICAL DATA:  Possible seizure at home.  No history of trauma. EXAM: CT HEAD WITHOUT CONTRAST TECHNIQUE: Contiguous axial images were obtained from the base of the skull through the vertex without intravenous contrast. RADIATION DOSE REDUCTION: This exam was performed according to the departmental dose-optimization program which includes automated exposure control, adjustment of the mA and/or kV according to patient size and/or use of iterative reconstruction technique. COMPARISON:  None Available. FINDINGS: Brain: The patient was scanned x2 due to motion. There is abundant patient motion on both attempts limiting the study. Where visible the brain demonstrates a grossly normal volume, gray-white matter attenuation and differentiation. There is no obvious space-occupying mass or hemorrhage, no visible recent infarct. There are benign dural calcifications scattered along side of the falx. Vascular: No hyperdense vessel or unexpected calcification is seen through the motion artifact. Skull: No fractures or focal lesions are seen through the motion artifact. Sinuses/Orbits: Visualized sinuses and mastoid air cells are clear. Negative visualized orbits. Other: None. IMPRESSION: Motion limited study. No obvious acute intracranial CT  findings. Follow-up study recommended when the patient is better able to cooperate. Electronically Signed   By: Francis Quam M.D.   On: 08/22/2024 03:49   CT ABDOMEN PELVIS W CONTRAST Result Date: 08/22/2024 EXAM: CT ABDOMEN AND PELVIS WITH CONTRAST 08/22/2024 03:20:20 AM TECHNIQUE: CT of the abdomen and pelvis was performed with the administration of 100 mL of iohexol (OMNIPAQUE) 300 MG/ML solution. Multiplanar reformatted images are provided for review. Automated exposure control, iterative reconstruction, and/or weight-based adjustment of the mA/kV was utilized to reduce the radiation dose to as low as reasonably achievable. COMPARISON: None available. CLINICAL HISTORY: Abdominal pain, acute, nonlocalized. Possible seizure at home. Bloody bite marks on bottom lip. Postictal and uncooperative. FINDINGS: LOWER CHEST: No acute abnormality. LIVER: Periportal edema favored due to volume status. GALLBLADDER AND BILE DUCTS: Gallbladder is unremarkable. No biliary  ductal dilatation. SPLEEN: No acute abnormality. PANCREAS: No acute abnormality. ADRENAL GLANDS: No acute abnormality. KIDNEYS, URETERS AND BLADDER: Duplicated bilateral renal collecting systems. No urinary calculi or hydronephrosis. No perinephric or periureteral stranding. Urinary bladder is unremarkable. GI AND BOWEL: Stomach demonstrates no acute abnormality. There is no bowel obstruction. Normal appendix. PERITONEUM AND RETROPERITONEUM: No ascites. No free air. VASCULATURE: Aorta is normal in caliber. LYMPH NODES: No lymphadenopathy. REPRODUCTIVE ORGANS: No acute abnormality. BONES AND SOFT TISSUES: No acute osseous abnormality. No focal soft tissue abnormality. IMPRESSION: 1. No acute findings in the abdomen or pelvis. 2. 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 3. Periportal edema, likely related to volume status. Electronically signed by: Norman Gatlin MD 08/22/2024 03:43 AM EDT RP Workstation: [id number]      PROCEDURES:  Critical Care performed: Yes, see critical care procedure note(s)   CRITICAL CARE Performed by: Josette Sink   Total critical care time: 30 minutes  Critical care time was exclusive of separately billable procedures and treating other patients.  Critical care was necessary to treat or prevent imminent or life-threatening deterioration.  Critical care was time spent personally by me on the following activities: development of treatment plan with patient and/or surrogate as well as nursing, discussions with consultants, evaluation of patient's response to treatment, examination of patient, obtaining history from patient or surrogate, ordering and performing treatments and interventions, ordering and review of laboratory studies, ordering and review of radiographic studies, pulse oximetry and re-evaluation of patient's condition.   SABRA1-3 Lead EKG Interpretation  Performed by: Decklyn Hyder, Josette SAILOR, DO Authorized by: Tawanda Schall, Josette SAILOR, DO     Interpretation: normal     ECG rate:  71   ECG rate assessment: normal     Rhythm: sinus rhythm     Ectopy: none     Conduction: normal       IMPRESSION / MDM / ASSESSMENT AND PLAN / ED COURSE  I reviewed the triage vital signs and the nursing notes.    Patient here with seizure-like activity witnessed by family.  Postictal with EMS.  Signs of tongue and lower lip biting.  Blood glucose here is normal.  Was just seen in the emergency department and discharged a few hours ago for nausea, vomiting, hematemesis.  The patient is on the cardiac monitor to evaluate for evidence of arrhythmia and/or significant heart rate changes.   DIFFERENTIAL DIAGNOSIS (includes but not limited to):   Seizure, UTI, viral gastroenteritis, dehydration, anemia, electrolyte derangement, intracranial hemorrhage, doubt stroke, meningitis   Patient's presentation is most consistent with acute presentation with potential threat to life or bodily  function.   PLAN: Suspect patient likely has underlying epilepsy given this is her second seizure likely triggered by a viral gastroenteritis, dehydration, a possible UTI.  Will repeat labs.  Will send urine culture and give Rocephin .  Will give IV fluids, IV Keppra.  Will monitor closely.  Will obtain CT head, abdomen pelvis.   MEDICATIONS GIVEN IN ED: Medications  levETIRAcetam (KEPPRA) undiluted injection 1,000 mg (1,000 mg Intravenous Given 08/22/24 0241)  cefTRIAXone  (ROCEPHIN ) 2 g in sodium chloride  0.9 % 100 mL IVPB (2 g Intravenous New Bag/Given 08/22/24 0246)  sodium chloride  0.9 % bolus 1,000 mL (1,000 mLs Intravenous New Bag/Given 08/22/24 0245)  iohexol (OMNIPAQUE) 300 MG/ML solution 100 mL (100 mLs Intravenous Contrast Given 08/22/24 0303)     ED COURSE: Patient postictal state is improving.  Able to answer questions appropriately.  CT head, CT abdomen pelvis unremarkable.  Have recommended admission for observation, hydration, antibiotics for UTI, antiepileptics and possible neurology consult while in the hospital.  Family comfortable with this plan.   CONSULTS:  Consulted and discussed patient's case with hospitalist, Dr. Cleatus.  I have recommended admission and consulting physician agrees and will place admission orders.  Patient (and family if present) agree with this plan.   I reviewed all nursing notes, vitals, pertinent previous records.  All labs, EKGs, imaging ordered have been independently reviewed and interpreted by myself.    OUTSIDE RECORDS REVIEWED: I can see outside hospital records from [facility] where patient was seen for convulsions in May 2025.       FINAL CLINICAL IMPRESSION(S) / ED DIAGNOSES   Final diagnoses:  Seizure-like activity (HCC)  Acute UTI  Dehydration  Nausea and vomiting in adult     Rx / DC Orders   ED Discharge Orders     None        Note:  This document was prepared using Dragon voice recognition software and may  include unintentional dictation errors.   Kartik Fernando, Josette SAILOR, DO 08/22/24 0353    Klarisa Barman, Josette SAILOR, DO 08/22/24 [phone]

## 2024-08-22 NOTE — ED Triage Notes (Signed)
 Pt arrived to ED for possible seizure at home. Witnessed by grandmother. EMS states grandmother didn't describe what kind of seizure it was or how long it lasted. Doesn't take any seizure medications. Pt does have bloody bite marks on her bottom lip. Pt is postictal  and not being very cooperative. Airway intact. Breathing WNL

## 2024-08-22 NOTE — H&P (Signed)
 History and Physical    Tammy Cross FMW:[id number] DOB: 05-Jun-2006 DOA: 08/22/2024  PCP: Patient, No Pcp Per (Confirm with patient/family/NH records and if not entered, this has to be entered at [facility] point of entry) Patient coming from: Home  I have personally briefly reviewed patient's old medical records in [facility]  Chief Complaint: seizure  HPI: Tammy Cross is a 18 y.o. female with medical history significant of IBS, seizures, presented with multiple complaints including intractable nausea vomiting abdominal pain and a witnessed seizure.  Patient initially came to ED yesterday afternoon for new onset of abdominal pain nauseous vomiting with streaks of blood.  No fever or chills or diarrhea.  Workup in the ED was benign and patient was given as needed medications including IV hydration and Zofran  Protonix and her symptoms resolved and discharged home.  Sometime around midnight, patient was found seizing by her grandmother, who described patient started to have foaming in her mouth and started to have whole body shaking and lost control of urine the whole episode lasted about 2 to 3 minutes.  Then seizure stopped and patient woke up and complained about tongue biting on the left corner and left lower lips.  Patient woke up confused patient does report that she had a similar seizure episode for the first time this may bowel leaving in [place] , when she was worked up for seizure but not discharged on any antiseizure medications.  Patient denies any dysuria, no urinary frequency and no diarrhea.  ED Course: Afebrile, nontachycardic no hypotension.  CT head negative for acute findings, CT abdomen pelvis negative for acute findings.  UA showed 1+ RBC and 2+ WBC, WBC 15.9 hemoglobin 11.5 BUN 9 creatinine 0.8 glucose 116.  Normal LFTs.  Patient was given IV hydration and Keppra loading dose and started on Keppra 500 mg twice daily.  Review of Systems: As per HPI otherwise 14 point review  of systems negative.    No past medical history on file.  Past Surgical History:  Procedure Laterality Date   EYE SURGERY       reports that she has never smoked. She has never used smokeless tobacco. She reports that she does not currently use alcohol. She reports that she does not use drugs.  No Known Allergies  No family history on file.   Prior to Admission medications   Medication Sig Start Date End Date Taking? Authorizing Provider  levETIRAcetam (KEPPRA) 500 MG tablet Take 500 mg by mouth 2 (two) times daily. 04/05/24  Yes [provider]  cyclobenzaprine  (FLEXERIL ) 5 MG tablet Take 1 tablet (5 mg total) by mouth 3 (three) times daily as needed for muscle spasms. 07/10/23   Margrette, Myah A, PA-C  etonogestrel  (NEXPLANON ) 68 MG IMPL implant Inject into the skin. 02/16/21   [provider]  metoCLOPramide (REGLAN) 10 MG tablet Take 1 tablet (10 mg total) by mouth every 8 (eight) hours as needed for nausea. 08/21/24 09/20/24  Levander Slate, MD    Physical Exam: Vitals:   08/22/24 0227 08/22/24 0229 08/22/24 0430 08/22/24 0438  BP:  (!) 115/91 107/75   Pulse:   85   Resp:   (!) 24   Temp:      TempSrc:      SpO2:   99%   Weight: 49.9 kg   49.9 kg  Height:    4' 11 (1.499 m)    Constitutional: NAD, calm, comfortable Vitals:   08/22/24 9772 08/22/24 0229 08/22/24 0430 08/22/24 9561  BP:  (!) 115/91 107/75   Pulse:   85   Resp:   (!) 24   Temp:      TempSrc:      SpO2:   99%   Weight: 49.9 kg   49.9 kg  Height:    4' 11 (1.499 m)   Eyes: PERRL, lids and conjunctivae normal ENMT: Mucous membranes are moist. Posterior pharynx clear of any exudate or lesions.Normal dentition.  Biting marks on left side of the tongue and left lower corner of lips Neck: normal, supple, no masses, no thyromegaly Respiratory: clear to auscultation bilaterally, no wheezing, no crackles. Normal respiratory effort. No accessory muscle use.  Cardiovascular: Regular rate and  rhythm, no murmurs / rubs / gallops. No extremity edema. 2+ pedal pulses. No carotid bruits.  Abdomen: no tenderness, no masses palpated. No hepatosplenomegaly. Bowel sounds positive.  Musculoskeletal: no clubbing / cyanosis. No joint deformity upper and lower extremities. Good ROM, no contractures. Normal muscle tone.  Skin: no rashes, lesions, ulcers. No induration Neurologic: CN 2-12 grossly intact. Sensation intact, DTR normal. Strength 5/5 in all 4.  Psychiatric: Normal judgment and insight. Alert and oriented x 3. Normal mood.    Labs on Admission: I have personally reviewed following labs and imaging studies  CBC: Recent Labs  Lab 08/21/24 1628 08/22/24 0240  WBC 15.1* 15.9*  NEUTROABS  --  13.4*  HGB 11.3* 11.5*  HCT 35.0* 35.2*  MCV 86.0 85.0  PLT 207 195   Basic Metabolic Panel: Recent Labs  Lab 08/21/24 1628 08/22/24 0240  NA 139 139  K 3.9 3.8  CL 109 107  CO2 17* 20*  GLUCOSE 90 116*  BUN 14 9  CREATININE 0.96 0.81  CALCIUM 9.1 9.5   GFR: Estimated Creatinine Clearance: 76.2 mL/min (by C-G formula based on SCr of 0.81 mg/dL). Liver Function Tests: Recent Labs  Lab 08/21/24 1628 08/22/24 0240  AST 26 33  ALT 13 14  ALKPHOS 51 44  BILITOT 1.5* 1.6*  PROT 7.5 7.6  ALBUMIN 4.5 4.4   Recent Labs  Lab 08/21/24 1628  LIPASE 11   No results for input(s): AMMONIA in the last 168 hours. Coagulation Profile: No results for input(s): INR, PROTIME in the last 168 hours. Cardiac Enzymes: No results for input(s): CKTOTAL, CKMB, CKMBINDEX, TROPONINI in the last 168 hours. BNP (last 3 results) No results for input(s): PROBNP in the last 8760 hours. HbA1C: No results for input(s): HGBA1C in the last 72 hours. CBG: Recent Labs  Lab 08/22/24 0228  GLUCAP 108*   Lipid Profile: No results for input(s): CHOL, HDL, LDLCALC, TRIG, CHOLHDL, LDLDIRECT in the last 72 hours. Thyroid Function Tests: No results for input(s): TSH,  T4TOTAL, FREET4, T3FREE, THYROIDAB in the last 72 hours. Anemia Panel: No results for input(s): VITAMINB12, FOLATE, FERRITIN, TIBC, IRON, RETICCTPCT in the last 72 hours. Urine analysis:    Component Value Date/Time   COLORURINE YELLOW (A) 08/21/2024 1631   APPEARANCEUR CLOUDY (A) 08/21/2024 1631   LABSPEC 1.016 08/21/2024 1631   PHURINE 5.0 08/21/2024 1631   GLUCOSEU NEGATIVE 08/21/2024 1631   HGBUR SMALL (A) 08/21/2024 1631   BILIRUBINUR NEGATIVE 08/21/2024 1631   KETONESUR 80 (A) 08/21/2024 1631   PROTEINUR >=300 (A) 08/21/2024 1631   NITRITE NEGATIVE 08/21/2024 1631   LEUKOCYTESUR NEGATIVE 08/21/2024 1631    Radiological Exams on Admission: CT HEAD WO CONTRAST ( ) Result Date: 08/22/2024 CLINICAL DATA:  Possible seizure at home.  No history of trauma. EXAM: CT HEAD  WITHOUT CONTRAST TECHNIQUE: Contiguous axial images were obtained from the base of the skull through the vertex without intravenous contrast. RADIATION DOSE REDUCTION: This exam was performed according to the departmental dose-optimization program which includes automated exposure control, adjustment of the mA and/or kV according to patient size and/or use of iterative reconstruction technique. COMPARISON:  None Available. FINDINGS: Brain: The patient was scanned x2 due to motion. There is abundant patient motion on both attempts limiting the study. Where visible the brain demonstrates a grossly normal volume, gray-white matter attenuation and differentiation. There is no obvious space-occupying mass or hemorrhage, no visible recent infarct. There are benign dural calcifications scattered along side of the falx. Vascular: No hyperdense vessel or unexpected calcification is seen through the motion artifact. Skull: No fractures or focal lesions are seen through the motion artifact. Sinuses/Orbits: Visualized sinuses and mastoid air cells are clear. Negative visualized orbits. Other: None. IMPRESSION: Motion  limited study. No obvious acute intracranial CT findings. Follow-up study recommended when the patient is better able to cooperate. Electronically Signed   By: Francis Quam M.D.   On: 08/22/2024 03:49   CT ABDOMEN PELVIS W CONTRAST Result Date: 08/22/2024 EXAM: CT ABDOMEN AND PELVIS WITH CONTRAST 08/22/2024 03:20:20 AM TECHNIQUE: CT of the abdomen and pelvis was performed with the administration of 100 mL of iohexol (OMNIPAQUE) 300 MG/ML solution. Multiplanar reformatted images are provided for review. Automated exposure control, iterative reconstruction, and/or weight-based adjustment of the mA/kV was utilized to reduce the radiation dose to as low as reasonably achievable. COMPARISON: None available. CLINICAL HISTORY: Abdominal pain, acute, nonlocalized. Possible seizure at home. Bloody bite marks on bottom lip. Postictal and uncooperative. FINDINGS: LOWER CHEST: No acute abnormality. LIVER: Periportal edema favored due to volume status. GALLBLADDER AND BILE DUCTS: Gallbladder is unremarkable. No biliary ductal dilatation. SPLEEN: No acute abnormality. PANCREAS: No acute abnormality. ADRENAL GLANDS: No acute abnormality. KIDNEYS, URETERS AND BLADDER: Duplicated bilateral renal collecting systems. No urinary calculi or hydronephrosis. No perinephric or periureteral stranding. Urinary bladder is unremarkable. GI AND BOWEL: Stomach demonstrates no acute abnormality. There is no bowel obstruction. Normal appendix. PERITONEUM AND RETROPERITONEUM: No ascites. No free air. VASCULATURE: Aorta is normal in caliber. LYMPH NODES: No lymphadenopathy. REPRODUCTIVE ORGANS: No acute abnormality. BONES AND SOFT TISSUES: No acute osseous abnormality. No focal soft tissue abnormality. IMPRESSION: 1. No acute findings in the abdomen or pelvis. 2. 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 3. Periportal edema, likely related to volume status. Electronically signed by: Norman Gatlin MD  08/22/2024 03:43 AM EDT RP Workstation: [id number]    EKG: Independently reviewed.  Sinus rhythm, no acute ST changes.  Assessment/Plan Principal Problem:   Seizure (HCC)  (please populate well all problems here in Problem List. (For example, if patient is on BP meds at home and you resume or decide to hold them, it is a problem that needs to be her. Same for CAD, COPD, HLD and so on)   Seizure - Seizure precaution -EEG - Given that this is the second episode of seizure with first episode happened in May of this year, will start patient on maintenance Keppra - Consult neurology  Intractable nausea vomiting abdominal pain Hematemesis - Appears to be self-limiting, clinically suspect IBS flareup.  Workup including CT abdomen pelvis showed no acute findings. - Continue as needed medications Zofran  and Bentyl for complaints -  vomiting of streaked blood appears to be related to repeated vomiting associated mucous membrane damage of stomach/esophagus.  H&H stable and likely  bleeding stopped along with resolving abdominal pain and nausea with vomiting.  Outpatient follow-up with GI - Continue PPI for now  Leukocytosis UTI - Continue ceftriaxone  - Other DDx, mentation at baseline no meningeal sign, low suspicion for CNS infection.  Hold off LP.  DVT prophylaxis: SCD Code Status: Full code Family Communication: Grandma at bedside Disposition Plan: Expect less than 2 midnight hospital stay Consults called: Neurology Admission status: Telemetry observation   Cort ONEIDA Mana MD [facility] Pager [phone]  08/22/2024, 7:53 AM

## 2024-08-22 NOTE — ED Notes (Addendum)
 Patients pulse ox, cardiac leads, and blood pressure applied at 7:43am. Patient took off pulse ox and blood pressure cuff when RN left room

## 2024-08-22 NOTE — Plan of Care (Signed)
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
## 2024-08-22 NOTE — ED Notes (Signed)
 MRI informed Pt is ambulatory and A&Ox4.

## 2024-08-22 NOTE — ED Notes (Signed)
 Pt to MRI

## 2024-08-22 NOTE — Procedures (Signed)
 Went to do eeg in ed-pt sleeping and pt s room was being cleaned. Wait for room .

## 2024-08-22 NOTE — ED Notes (Signed)
 Pt noted to walk to the restroom w/ no assistance or difficulty.

## 2024-08-23 ENCOUNTER — Observation Stay

## 2024-08-23 DIAGNOSIS — R569 Unspecified convulsions: Secondary | ICD-10-CM | POA: Diagnosis not present

## 2024-08-23 DIAGNOSIS — G40909 Epilepsy, unspecified, not intractable, without status epilepticus: Secondary | ICD-10-CM | POA: Diagnosis not present

## 2024-08-23 LAB — CBC
HCT: 31.1 % — ABNORMAL LOW (ref 36.0–46.0)
Hemoglobin: 9.8 g/dL — ABNORMAL LOW (ref 12.0–15.0)
MCH: 27.3 pg (ref 26.0–34.0)
MCHC: 31.5 g/dL (ref 30.0–36.0)
MCV: 86.6 fL (ref 80.0–100.0)
Platelets: 143 K/uL — ABNORMAL LOW (ref 150–400)
RBC: 3.59 MIL/uL — ABNORMAL LOW (ref 3.87–5.11)
RDW: 13 % (ref 11.5–15.5)
WBC: 6.6 K/uL (ref 4.0–10.5)
nRBC: 0 % (ref 0.0–0.2)

## 2024-08-23 LAB — URINE CULTURE: Culture: <10000 — AB

## 2024-08-23 LAB — HIV ANTIBODY (ROUTINE TESTING W REFLEX): HIV Screen 4th Generation wRfx: NONREACTIVE

## 2024-08-23 MED ORDER — MORPHINE SULFATE (PF) 2 MG/ML IV SOLN: 1.0000 mg | INTRAVENOUS | Status: DC | PRN

## 2024-08-23 MED ORDER — OXYCODONE-ACETAMINOPHEN 5-325 MG PO TABS: 1.0000 | ORAL_TABLET | Freq: Four times a day (QID) | ORAL | Status: DC | PRN

## 2024-08-23 MED ORDER — PANTOPRAZOLE SODIUM 40 MG PO TBEC: 40.0000 mg | DELAYED_RELEASE_TABLET | Freq: Every day | ORAL | 0 refills | Status: AC

## 2024-08-23 MED ORDER — MORPHINE SULFATE (PF) 2 MG/ML IV SOLN
INTRAVENOUS | Status: AC
  Administered 2024-08-23: 1 mg via INTRAVENOUS
  Filled 2024-08-23: qty 1

## 2024-08-23 MED ORDER — LEVETIRACETAM 500 MG PO TABS: 500.0000 mg | ORAL_TABLET | Freq: Two times a day (BID) | ORAL | 0 refills | Status: AC

## 2024-08-23 NOTE — Discharge Summary (Signed)
 Physician Discharge Summary  Goldy Calandra FMW:[id number] DOB: September 17, 2006 DOA: 08/22/2024  PCP: Patient, No Pcp Per  Admit date: 08/22/2024 Discharge date: 08/23/2024  Admitted From: home Disposition:    Recommendations for Outpatient Follow-up:  Follow up with PCP in 1-2 weeks F/u w/ neuro in 8-12 weeks F/u w/ GI in 1-2 weeks for IBS & if you continue to have intractable nausea & vomiting Get a repeat CBC in 1-2 weeks to check Hb & Plt   Home Health: no  Equipment/Devices:  Discharge Condition: stable  CODE STATUS: full  Diet recommendation: Regular  Brief/Interim Summary: HPI was taken from Dr. Laurita: Tammy Cross is a 18 y.o. female with medical history significant of IBS, seizures, presented with multiple complaints including intractable nausea vomiting abdominal pain and a witnessed seizure.   Patient initially came to ED yesterday afternoon for new onset of abdominal pain nauseous vomiting with streaks of blood.  No fever or chills or diarrhea.  Workup in the ED was benign and patient was given as needed medications including IV hydration and Zofran  Protonix and her symptoms resolved and discharged home.  Sometime around midnight, patient was found seizing by her grandmother, who described patient started to have foaming in her mouth and started to have whole body shaking and lost control of urine the whole episode lasted about 2 to 3 minutes.  Then seizure stopped and patient woke up and complained about tongue biting on the left corner and left lower lips.  Patient woke up confused patient does report that she had a similar seizure episode for the first time this may bowel leaving in [facility] , when she was worked up for seizure but not discharged on any antiseizure medications.  Patient denies any dysuria, no urinary frequency and no diarrhea.   ED Course: Afebrile, nontachycardic no hypotension.  CT head negative for acute findings, CT abdomen pelvis negative for acute findings.   UA showed 1+ RBC and 2+ WBC, WBC 15.9 hemoglobin 11.5 BUN 9 creatinine 0.8 glucose 116.  Normal LFTs.   Patient was given IV hydration and Keppra loading dose and started on Keppra 500 mg twice daily.    Discharge Diagnoses:  Principal Problem:   Seizure [facility])  Seizure: was given keppra after last seizure in May 2025 but per pt she did not have any idea that she was prescribed keppra. EEG was WNL. Continue on keppra as per neuro. Neuro following and recs apprec. Will need to f/u outpatient w/ neuro in 8-12 weeks as per neuro    Intractable nausea, vomiting, abdominal pain: etiology unclear. CT abd/pelvis showed no acute findings. Resolved. Tolerating a po diet.   Hematemesis: no hematemesis today so far. H&H trending down but likely dilutional from IVFs. Vitals are stable   Thrombocytopenia: etiology unclear. Possibly dilutional. Can f/u w/ labs in 1-2 weeks    Leukocytosis: WNL today.  ? UTI: urine cx grows insignificant growth. Received 2 doses of IV rocephin    Marijuana use: urine drug screen was positive for marijuana. Received cessation counseling already    Discharge Instructions  Discharge Instructions     Diet general   Complete by: As directed    Discharge instructions   Complete by: As directed    F/u w/ PCP in 1-2 weeks. F/u w/ neuro, Dr. Lane or neurologist of your choice, in 8-12 weeks. Will need to f/u w/ GI for IBS and if you continue on intractable nausea & vomiting in 1-2 weeks.   SEIZURE PRECAUTIONS Per [employer]  [place] DMV statutes, patients with seizures are not allowed to drive until they have been seizure-free for six months. Use caution when using heavy equipment or power tools. Avoid working on ladders or at heights. Take showers instead of baths. Ensure the water temperature is not too high on the home water heater. Do not go swimming alone. Do not lock yourself in a room alone (i.e. bathroom). When caring for infants or small children, sit down when  holding, feeding, or changing them to minimize risk of injury to the child in the event you have a seizure. Maintain good sleep hygiene. Avoid alcohol.  If patient has another seizure, call 911 and bring them back to the ED if: A.  The seizure lasts longer than 5 minutes.     B.  The patient doesn't wake shortly after the seizure or has new problems such as difficulty seeing, speaking or moving following the seizure C.  The patient was injured during the seizure D.  The patient has a temperature over 102 F (39C) E.  The patient vomited during the seizure and now is having trouble breathing   Discharge instructions   Complete by: As directed    Will need to get repeat CBC in 1-2 weeks to check Hb & platelet levels. Your PCP can order a CBC   Increase activity slowly   Complete by: As directed       Allergies as of 08/23/2024   No Known Allergies      Medication List     STOP taking these medications    cyclobenzaprine  5 MG tablet Commonly known as: FLEXERIL        TAKE these medications    etonogestrel  68 MG Impl implant Commonly known as: NEXPLANON  Inject into the skin.   levETIRAcetam 500 MG tablet Commonly known as: KEPPRA Take 1 tablet (500 mg total) by mouth 2 (two) times daily.   metoCLOPramide 10 MG tablet Commonly known as: REGLAN Take 1 tablet (10 mg total) by mouth every 8 (eight) hours as needed for nausea.   pantoprazole 40 MG tablet Commonly known as: Protonix Take 1 tablet (40 mg total) by mouth daily.        No Known Allergies  Consultations: Neuro    Procedures/Studies: EEG adult Result Date: 08/23/2024 Shelton Arlin KIDD, MD     08/23/2024 12:25 PM Patient Name: Tammy Cross MRN: [id number] Epilepsy Attending: Arlin KIDD Shelton Referring Physician/Provider: Laurita Cort DASEN, MD Date: 08/23/2024 Duration: 29.32 mins Patient history:  18 y.o. female with a history of seizure-new onset seizure likely in May of this year and now recurrent seizure in the setting  of noncompliance to medications, presenting with breakthrough seizure. EEG to evaluate for seizure Level of alertness: Awake, drowsy AEDs during EEG study: LEV Technical aspects: This EEG study was done with scalp electrodes positioned according to the 10-20 International system of electrode placement. Electrical activity was reviewed with band pass filter of 1-70Hz , sensitivity of 7 uV/mm, display speed of 79mm/sec with a 60Hz  notched filter applied as appropriate. EEG data were recorded continuously and digitally stored.  Video monitoring was available and reviewed as appropriate. Description: The posterior dominant rhythm consists of 9-10 Hz activity of moderate voltage (25-35 uV) seen predominantly in posterior head regions, symmetric and reactive to eye opening and eye closing. Drowsiness was characterized by attenuation of the posterior background rhythm. Hyperventilation and photic stimulation were not performed.   IMPRESSION: This study is within normal limits. No seizures or epileptiform discharges were  seen throughout the recording. A normal interictal EEG does not exclude the diagnosis of epilepsy. Arlin MALVA Krebs   MR BRAIN W WO CONTRAST Result Date: 08/22/2024 EXAM: MRI BRAIN WITH AND WITHOUT CONTRAST 08/22/2024 01:41:16 PM TECHNIQUE: Multiplanar multisequence MRI of the head/brain was performed with and without the administration of intravenous contrast. 5 mL (gadobutrol (GADAVIST) 1 MMOL/ML injection 5 mL GADOBUTROL 1 MMOL/ML IV SOLN). COMPARISON: Same day CT head. CLINICAL HISTORY: Seizure disorder, clinical change. FINDINGS: BRAIN AND VENTRICLES: No acute infarct. No acute intracranial hemorrhage. No mass effect or midline shift. No hydrocephalus. The sella is unremarkable. Normal flow voids. No mass or abnormal enhancement. There is no signal abnormality within the mesial temporal lobes. The hippocampi are symmetric with preserved internal architecture. Symmetric appearance of the fornices.  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 There are faint linear foci along the medial and lateral aspects of the left globe which are likely related to artifact. There is no evidence of intraocular hemorrhage or abnormal enhancement within the left globe. Recommend correlation with fundoscopic exam. SINUSES: Mild mucosal thickening in the right ethmoid sinus. BONES AND SOFT TISSUES: Normal bone marrow signal and enhancement. No acute soft tissue abnormality. IMPRESSION: 1. No acute intracranial abnormality. 2. Asymmetric enlargement of the left globe with elongation of the posterior aspect, suggestive of axial myopia. Curvilinear signal abnormality along the left globe is likely artifactual. No evidence of intraocular hemorrhage or abnormal enhancement. Recommend correlation with fundoscopic exam. Electronically signed by: Donnice Mania MD 08/22/2024 02:45 PM EDT RP Workstation: [id number]   CT HEAD WO CONTRAST ( ) Result Date: 08/22/2024 CLINICAL DATA:  Possible seizure at home.  No history of trauma. EXAM: CT HEAD WITHOUT CONTRAST TECHNIQUE: Contiguous axial images were obtained from the base of the skull through the vertex without intravenous contrast. RADIATION DOSE REDUCTION: This exam was performed according to the departmental dose-optimization program which includes automated exposure control, adjustment of the mA and/or kV according to patient size and/or use of iterative reconstruction technique. COMPARISON:  None Available. FINDINGS: Brain: The patient was scanned x2 due to motion. There is abundant patient motion on both attempts limiting the study. Where visible the brain demonstrates a grossly normal volume, gray-white matter attenuation and differentiation. There is no obvious space-occupying mass or hemorrhage, no visible recent infarct. There are benign dural calcifications scattered along  side of the falx. Vascular: No hyperdense vessel or unexpected calcification is seen through the motion artifact. Skull: No fractures or focal lesions are seen through the motion artifact. Sinuses/Orbits: Visualized sinuses and mastoid air cells are clear. Negative visualized orbits. Other: None. IMPRESSION: Motion limited study. No obvious acute intracranial CT findings. Follow-up study recommended when the patient is better able to cooperate. Electronically Signed   By: Francis Quam M.D.   On: 08/22/2024 03:49   CT ABDOMEN PELVIS W CONTRAST Result Date: 08/22/2024 EXAM: CT ABDOMEN AND PELVIS WITH CONTRAST 08/22/2024 03:20:20 AM TECHNIQUE: CT of the abdomen and pelvis was performed with the administration of 100 mL of iohexol (OMNIPAQUE) 300 MG/ML solution. Multiplanar reformatted images are provided for review. Automated exposure control, iterative reconstruction, and/or weight-based adjustment of the mA/kV was utilized to reduce the radiation dose to as low as reasonably achievable. COMPARISON: None available. CLINICAL HISTORY: Abdominal pain, acute, nonlocalized. Possible seizure at home. Bloody bite marks on bottom lip. Postictal and uncooperative. FINDINGS: LOWER CHEST: No acute abnormality. LIVER: Periportal edema  favored due to volume status. GALLBLADDER AND BILE DUCTS: Gallbladder is unremarkable. No biliary ductal dilatation. SPLEEN: No acute abnormality. PANCREAS: No acute abnormality. ADRENAL GLANDS: No acute abnormality. KIDNEYS, URETERS AND BLADDER: Duplicated bilateral renal collecting systems. No urinary calculi or hydronephrosis. No perinephric or periureteral stranding. Urinary bladder is unremarkable. GI AND BOWEL: Stomach demonstrates no acute abnormality. There is no bowel obstruction. Normal appendix. PERITONEUM AND RETROPERITONEUM: No ascites. No free air. VASCULATURE: Aorta is normal in caliber. LYMPH NODES: No lymphadenopathy. REPRODUCTIVE ORGANS: No acute abnormality. BONES AND SOFT  TISSUES: No acute osseous abnormality. No focal soft tissue abnormality. IMPRESSION: 1. No acute findings in the abdomen or pelvis. 2. 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 3. Periportal edema, likely related to volume status. Electronically signed by: Norman Gatlin MD 08/22/2024 03:43 AM EDT RP Workstation: [id number]   (Echo, Carotid, EGD, Colonoscopy, ERCP)    Subjective: Pt denies any pain    Discharge Exam: Vitals:   08/23/24 0428 08/23/24 0730  BP: (!) 95/58 (!) 105/52  Pulse: (!) 58 (!) 55  Resp: 16 15  Temp: 97.7 F (36.5 C) 99.2 F (37.3 C)  SpO2: 100% 100%   Vitals:   08/22/24 1932 08/23/24 0004 08/23/24 0428 08/23/24 0730  BP: (!) 104/45 (!) 113/54 (!) 95/58 (!) 105/52  Pulse: 85 61 (!) 58 (!) 55  Resp: 16 16 16 15   Temp: 98.5 F (36.9 C) 98.3 F (36.8 C) 97.7 F (36.5 C) 99.2 F (37.3 C)  TempSrc:    Oral  SpO2: 100% 100% 100% 100%  Weight:      Height:        General: Pt is alert, awake, not in acute distress Cardiovascular:  S1/S2 +, no rubs, no gallops Respiratory: CTA bilaterally, no wheezing, no rhonchi Abdominal: Soft, NT, ND, bowel sounds + Extremities: no edema, no cyanosis    The results of significant diagnostics from this hospitalization (including imaging, microbiology, ancillary and laboratory) are listed below for reference.     Microbiology: Recent Results (from the past 240 hours)  Resp panel by RT-PCR (RSV, Flu A&B, Covid) Throat     Status: None   Collection Time: 08/21/24  4:28 PM   Specimen: Throat; Nasal Swab  Result Value Ref Range Status   SARS Coronavirus 2 by RT PCR NEGATIVE NEGATIVE Final    Comment: (NOTE) SARS-CoV-2 target nucleic acids are NOT DETECTED.  The SARS-CoV-2 RNA is generally detectable in upper respiratory specimens during the acute phase of infection. The lowest concentration of SARS-CoV-2 viral copies this assay can detect is 138 copies/mL. A negative result  does not preclude SARS-Cov-2 infection and should not be used as the sole basis for treatment or other patient management decisions. A negative result may occur with  improper specimen collection/handling, submission of specimen other than nasopharyngeal swab, presence of viral mutation(s) within the areas targeted by this assay, and inadequate number of viral copies(<138 copies/mL). A negative result must be combined with clinical observations, patient history, and epidemiological information. The expected result is Negative.  Fact Sheet for Patients:  BloggerCourse.com  Fact Sheet for Healthcare Providers:  SeriousBroker.it  This test is no t yet approved or cleared by the [place]  FDA and  has been authorized for detection and/or diagnosis of SARS-CoV-2 by FDA under an Emergency Use Authorization (EUA). This EUA will remain  in effect (meaning this test can be used) for the duration of the COVID-19 declaration under Section 564(b)(1) of the Act, 21 U.S.C.section  360bbb-3(b)(1), unless the authorization is terminated  or revoked sooner.       Influenza A by PCR NEGATIVE NEGATIVE Final   Influenza B by PCR NEGATIVE NEGATIVE Final    Comment: (NOTE) The Xpert Xpress SARS-CoV-2/FLU/RSV plus assay is intended as an aid in the diagnosis of influenza from Nasopharyngeal swab specimens and should not be used as a sole basis for treatment. Nasal washings and aspirates are unacceptable for Xpert Xpress SARS-CoV-2/FLU/RSV testing.  Fact Sheet for Patients: BloggerCourse.com  Fact Sheet for Healthcare Providers: SeriousBroker.it  This test is not yet approved or cleared by the [place]  FDA and has been authorized for detection and/or diagnosis of SARS-CoV-2 by FDA under an Emergency Use Authorization (EUA). This EUA will remain in effect (meaning this test can be used) for  the duration of the COVID-19 declaration under Section 564(b)(1) of the Act, 21 U.S.C. section 360bbb-3(b)(1), unless the authorization is terminated or revoked.     Resp Syncytial Virus by PCR NEGATIVE NEGATIVE Final    Comment: (NOTE) Fact Sheet for Patients: BloggerCourse.com  Fact Sheet for Healthcare Providers: SeriousBroker.it  This test is not yet approved or cleared by the [place]  FDA and has been authorized for detection and/or diagnosis of SARS-CoV-2 by FDA under an Emergency Use Authorization (EUA). This EUA will remain in effect (meaning this test can be used) for the duration of the COVID-19 declaration under Section 564(b)(1) of the Act, 21 U.S.C. section 360bbb-3(b)(1), unless the authorization is terminated or revoked.  Performed at [facility], [address] [place], [place] [address]   Group A Strep by PCR [facility] Only)     Status: None   Collection Time: 08/21/24  4:28 PM   Specimen: Throat; Sterile Swab  Result Value Ref Range Status   Group A Strep by PCR NOT DETECTED NOT DETECTED Final    Comment: Performed at [facility], [address] [place], [place] [address]  Urine Culture     Status: None   Collection Time: 08/21/24  4:31 PM   Specimen: Urine, Clean Catch  Result Value Ref Range Status   Specimen Description   Final    URINE, CLEAN CATCH Performed at [facility], [address], [place], [place] [address]    Special Requests   Final    NONE Performed at [facility], [address] [place], [place] [address]    Culture   Final    NO GROWTH Performed at [facility], [address] [place], [place] [address]    Report Status 08/22/2024 FINAL  Final  Urine Culture     Status: Abnormal   Collection Time: 08/22/24  3:29 AM   Specimen: Urine, Clean Catch  Result Value Ref Range Status   Specimen Description   Final    URINE, CLEAN  CATCH Performed at [facility], [address], [place], [place] [address]    Special Requests   Final    NONE Performed at [facility], [address] [place], [place] [address]    Culture (A)  Final    <10,000 COLONIES/mL INSIGNIFICANT GROWTH Performed at [facility], [address] [place], [place] [address]    Report Status 08/23/2024 FINAL  Final     Labs: BNP (last 3 results) No results for input(s): BNP in the last 8760 hours. Basic Metabolic Panel: Recent Labs  Lab 08/21/24 1628 08/22/24 0240  NA 139 139  K 3.9 3.8  CL 109 107  CO2 17* 20*  GLUCOSE 90 116*  BUN 14 9  CREATININE 0.96 0.81  CALCIUM 9.1 9.5   Liver Function Tests: Recent Labs  Lab 08/21/24 1628 08/22/24 0240  AST 26 33  ALT 13 14  ALKPHOS 51 44  BILITOT 1.5* 1.6*  PROT 7.5 7.6  ALBUMIN 4.5 4.4   Recent Labs  Lab 08/21/24 1628  LIPASE 11   No results for input(s): AMMONIA in the last 168 hours. CBC: Recent Labs  Lab 08/21/24 1628 08/22/24 0240 08/23/24 0529  WBC 15.1* 15.9* 6.6  NEUTROABS  --  13.4*  --   HGB 11.3* 11.5* 9.8*  HCT 35.0* 35.2* 31.1*  MCV 86.0 85.0 86.6  PLT 207 195 143*   Cardiac Enzymes: No results for input(s): CKTOTAL, CKMB, CKMBINDEX, TROPONINI in the last 168 hours. BNP: Invalid input(s): POCBNP CBG: Recent Labs  Lab 08/22/24 0228  GLUCAP 108*   D-Dimer No results for input(s): DDIMER in the last 72 hours. Hgb A1c No results for input(s): HGBA1C in the last 72 hours. Lipid Profile No results for input(s): CHOL, HDL, LDLCALC, TRIG, CHOLHDL, LDLDIRECT in the last 72 hours. Thyroid function studies No results for input(s): TSH, T4TOTAL, T3FREE, THYROIDAB in the last 72 hours.  Invalid input(s): FREET3 Anemia work up No results for input(s): VITAMINB12, FOLATE, FERRITIN, TIBC, IRON, RETICCTPCT in the last 72 hours. Urinalysis    Component Value Date/Time    COLORURINE YELLOW (A) 08/21/2024 1631   APPEARANCEUR CLOUDY (A) 08/21/2024 1631   LABSPEC 1.016 08/21/2024 1631   PHURINE 5.0 08/21/2024 1631   GLUCOSEU NEGATIVE 08/21/2024 1631   HGBUR SMALL (A) 08/21/2024 1631   BILIRUBINUR NEGATIVE 08/21/2024 1631   KETONESUR 80 (A) 08/21/2024 1631   PROTEINUR >=300 (A) 08/21/2024 1631   NITRITE NEGATIVE 08/21/2024 1631   LEUKOCYTESUR NEGATIVE 08/21/2024 1631   Sepsis Labs Recent Labs  Lab 08/21/24 1628 08/22/24 0240 08/23/24 0529  WBC 15.1* 15.9* 6.6   Microbiology Recent Results (from the past 240 hours)  Resp panel by RT-PCR (RSV, Flu A&B, Covid) Throat     Status: None   Collection Time: 08/21/24  4:28 PM   Specimen: Throat; Nasal Swab  Result Value Ref Range Status   SARS Coronavirus 2 by RT PCR NEGATIVE NEGATIVE Final    Comment: (NOTE) SARS-CoV-2 target nucleic acids are NOT DETECTED.  The SARS-CoV-2 RNA is generally detectable in upper respiratory specimens during the acute phase of infection. The lowest concentration of SARS-CoV-2 viral copies this assay can detect is 138 copies/mL. A negative result does not preclude SARS-Cov-2 infection and should not be used as the sole basis for treatment or other patient management decisions. A negative result may occur with  improper specimen collection/handling, submission of specimen other than nasopharyngeal swab, presence of viral mutation(s) within the areas targeted by this assay, and inadequate number of viral copies(<138 copies/mL). A negative result must be combined with clinical observations, patient history, and epidemiological information. The expected result is Negative.  Fact Sheet for Patients:  BloggerCourse.com  Fact Sheet for Healthcare Providers:  SeriousBroker.it  This test is no t yet approved or cleared by the [place]  FDA and  has been authorized for detection and/or diagnosis of SARS-CoV-2 by FDA under an  Emergency Use Authorization (EUA). This EUA will remain  in effect (meaning this test can be used) for the duration of the COVID-19 declaration under Section 564(b)(1) of the Act, 21 U.S.C.section 360bbb-3(b)(1), unless the authorization is terminated  or revoked sooner.       Influenza A by PCR NEGATIVE NEGATIVE Final   Influenza B by PCR NEGATIVE NEGATIVE Final    Comment: (NOTE) The Xpert Xpress SARS-CoV-2/FLU/RSV plus assay is intended as an aid in the diagnosis of influenza from Nasopharyngeal swab specimens and should not be used as a sole basis for treatment. Nasal washings and aspirates are unacceptable for Xpert Xpress SARS-CoV-2/FLU/RSV testing.  Fact Sheet for Patients: BloggerCourse.com  Fact Sheet for Healthcare Providers: SeriousBroker.it  This test is not yet approved or cleared by the [place]  FDA and has been authorized for detection and/or diagnosis of SARS-CoV-2 by FDA under an Emergency Use Authorization (EUA). This EUA will remain in effect (meaning this test can be used) for the duration of the COVID-19 declaration under Section 564(b)(1) of the Act, 21 U.S.C. section 360bbb-3(b)(1), unless the authorization is terminated or revoked.     Resp Syncytial Virus by PCR NEGATIVE NEGATIVE Final    Comment: (NOTE) Fact Sheet for Patients: BloggerCourse.com  Fact Sheet for Healthcare Providers: SeriousBroker.it  This test is not yet approved or cleared by the [place]  FDA and has been authorized for detection and/or diagnosis of SARS-CoV-2 by FDA under an Emergency Use Authorization (EUA). This EUA will remain in effect (meaning this test can be used) for the duration of the COVID-19 declaration under Section 564(b)(1) of the Act, 21 U.S.C. section 360bbb-3(b)(1), unless the authorization is terminated or revoked.  Performed at [facility], [address] [place], [place] [address]   Group A Strep by PCR [facility] Only)     Status: None   Collection Time: 08/21/24  4:28 PM   Specimen: Throat; Sterile Swab  Result Value Ref Range Status   Group A Strep by PCR NOT DETECTED NOT DETECTED Final    Comment: Performed at [facility], [address] [place], [place] [address]  Urine Culture     Status: None   Collection Time: 08/21/24  4:31 PM   Specimen: Urine, Clean Catch  Result Value Ref Range Status   Specimen Description   Final    URINE, CLEAN CATCH Performed at [facility], [address] [place], [place] [address]    Special Requests   Final    NONE Performed at [facility], [address] [place], [place] [address]    Culture   Final    NO GROWTH Performed at [facility], [address], [place], [place] [address]    Report Status 08/22/2024 FINAL  Final  Urine Culture     Status: Abnormal   Collection Time: 08/22/24  3:29 AM   Specimen: Urine, Clean Catch  Result Value Ref Range Status   Specimen Description   Final    URINE, CLEAN CATCH Performed at [facility], [address], [place], [place] [address]    Special Requests   Final    NONE Performed at [facility], [address], [place], [place] [address]    Culture (A)  Final    <10,000 COLONIES/mL INSIGNIFICANT GROWTH Performed at [facility], [address] [place], [place] [address]    Report Status 08/23/2024 FINAL  Final     Time coordinating discharge: 34 minutes  SIGNED:   Anthony CHRISTELLA Pouch, MD  [facility] 08/23/2024, 2:08 PM Pager   If 7PM-7AM, please contact night-coverage [email or web address]

## 2024-08-23 NOTE — Progress Notes (Signed)
 Eeg done

## 2024-08-23 NOTE — Progress Notes (Signed)
 Pt is A&Ox4. Pt is off unit for EEG. Pt is stable at this time.

## 2024-08-23 NOTE — Procedures (Signed)
 Patient Name: Tammy Cross  MRN: [id number]  Epilepsy Attending: Arlin MALVA Krebs  Referring Physician/Provider: Laurita Cort DASEN, MD  Date: 08/23/2024  Duration: 29.32 mins  Patient history:  18 y.o. female with a history of seizure-new onset seizure likely in May of this year and now recurrent seizure in the setting of noncompliance to medications, presenting with breakthrough seizure. EEG to evaluate for seizure  Level of alertness: Awake, drowsy  AEDs during EEG study: LEV  Technical aspects: This EEG study was done with scalp electrodes positioned according to the 10-20 International system of electrode placement. Electrical activity was reviewed with band pass filter of 1-70Hz , sensitivity of 7 uV/mm, display speed of 48mm/sec with a 60Hz  notched filter applied as appropriate. EEG data were recorded continuously and digitally stored.  Video monitoring was available and reviewed as appropriate.  Description: The posterior dominant rhythm consists of 9-10 Hz activity of moderate voltage (25-35 uV) seen predominantly in posterior head regions, symmetric and reactive to eye opening and eye closing. Drowsiness was characterized by attenuation of the posterior background rhythm. Hyperventilation and photic stimulation were not performed.     IMPRESSION: This study is within normal limits. No seizures or epileptiform discharges were seen throughout the recording.  A normal interictal EEG does not exclude the diagnosis of epilepsy.   Blaise Palladino O Tashae Inda

## 2024-08-23 NOTE — Discharge Instructions (Signed)
 Some PCP options in [place] area- not a comprehensive list  [facility]- [phone] [facility]- [phone] [facility]- [phone] [facility]- [phone] [facility]- [phone] Nich[facility]- [phone]  or [facility] Physician Referral Line [phone]

## 2024-08-23 NOTE — Progress Notes (Signed)
 NEUROLOGY CONSULT FOLLOW UP NOTE   Date of service: August 23, 2024 Patient Name: Tammy Cross MRN:  [id number] DOB:  12-12-2005  Interval Hx/subjective  Seen and examined  Vitals   Vitals:   08/22/24 1932 08/23/24 0004 08/23/24 0428 08/23/24 0730  BP: (!) 104/45 (!) 113/54 (!) 95/58 (!) 105/52  Pulse: 85 61 (!) 58 (!) 55  Resp: 16 16 16 15   Temp: 98.5 F (36.9 C) 98.3 F (36.8 C) 97.7 F (36.5 C) 99.2 F (37.3 C)  TempSrc:    Oral  SpO2: 100% 100% 100% 100%  Weight:      Height:         Body mass index is 22.21 kg/m.  Physical Exam   General: Well-developed well-nourished HEENT: Normocephalic/atraumatic, healing lateral tongue bites CVS regular rate rhythm Respiratory: Breathing well saturating normally on room air Neurological exam Awake alert oriented x 3, no aphasia, no dysarthria, no cranial nerve motor or sensory deficits at this time. Normal neurological exam  Medications  Current Facility-Administered Medications:    acetaminophen  (TYLENOL ) tablet 650 mg, 650 mg, Oral, Q4H PRN **OR** acetaminophen  (TYLENOL ) suppository 650 mg, 650 mg, Rectal, Q4H PRN, Cleatus Delayne GAILS, MD   cefTRIAXone  (ROCEPHIN ) 1 g in sodium chloride  0.9 % 100 mL IVPB, 1 g, Intravenous, Q24H, Laurita Manor T, MD, Last Rate: 200 mL/hr at 08/23/24 0858, 1 g at 08/23/24 0858   dicyclomine (BENTYL) capsule 10 mg, 10 mg, Oral, TID PRN, Zhang, Ping T, MD   enoxaparin (LOVENOX) injection 40 mg, 40 mg, Subcutaneous, Q24H, Cleatus Delayne V, MD, 40 mg at 08/23/24 0856   levETIRAcetam (KEPPRA) undiluted injection 500 mg, 500 mg, Intravenous, Q12H, Cleatus Delayne GAILS, MD, 500 mg at 08/22/24 2105   lip balm (CARMEX) ointment, , Topical, PRN, Laurita Manor DASEN, MD, 1 Application at 08/22/24 1835   LORazepam (ATIVAN) injection 1 mg, 1 mg, Intravenous, Q5 Min x 2 PRN, Cleatus Delayne GAILS, MD   morphine (PF) 2 MG/ML injection 1 mg, 1 mg, Intravenous, Q4H PRN, Trudy Anthony HERO, MD, 1 mg at 08/23/24 0856   ondansetron   (ZOFRAN ) tablet 4 mg, 4 mg, Oral, Q6H PRN **OR** ondansetron  (ZOFRAN ) injection 4 mg, 4 mg, Intravenous, Q6H PRN, Cleatus Delayne GAILS, MD   Oral care mouth rinse, 15 mL, Mouth Rinse, Q2H, Laurita Manor T, MD, 15 mL at 08/23/24 0850   Oral care mouth rinse, 15 mL, Mouth Rinse, PRN, Laurita Manor T, MD   oxyCODONE-acetaminophen  (PERCOCET/ROXICET) 5-325 MG per tablet 1 tablet, 1 tablet, Oral, Q6H PRN, Trudy Anthony HERO, MD   pantoprazole (PROTONIX) injection 40 mg, 40 mg, Intravenous, Q24H, Laurita Manor T, MD, 40 mg at 08/23/24 0856  Labs and Diagnostic Imaging   CBC:  Recent Labs  Lab 08/22/24 0240 08/23/24 0529  WBC 15.9* 6.6  NEUTROABS 13.4*  --   HGB 11.5* 9.8*  HCT 35.2* 31.1*  MCV 85.0 86.6  PLT 195 143*    Basic Metabolic Panel:  Lab Results  Component Value Date   NA 139 08/22/2024   K 3.8 08/22/2024   CO2 20 (L) 08/22/2024   GLUCOSE 116 (H) 08/22/2024   BUN 9 08/22/2024   CREATININE 0.81 08/22/2024   CALCIUM 9.5 08/22/2024   GFRNONAA >60 08/22/2024   Urine Drug Screen:     Component Value Date/Time   LABOPIA NONE DETECTED 08/22/2024 0328   COCAINSCRNUR NONE DETECTED 08/22/2024 0328   LABBENZ NONE DETECTED 08/22/2024 0328   AMPHETMU NONE DETECTED 08/22/2024 0328   THCU POSITIVE (  A) 08/22/2024 0328   LABBARB NONE DETECTED 08/22/2024 0328    Alcohol Level     Component Value Date/Time   [facility] <15 08/22/2024 0240   Imaging personally reviewed: MRI brain with and without contrast with no acute intracranial abnormality. EEG-no acute findings.  Normal.    Assessment   Tammy Cross is a 18 y.o. female with a history of seizure who had a new onset seizure likely in May of this year or even before, now presenting with breakthrough seizure. There was some confusion about her medications-her outpatient chart from [place]  where she was seen after the last seizure lists that she was recommended to stay on her antiepileptics but she was never given a prescription according  to her. At this time, I think she needs to be on an antiepileptic medicine-Keppra has been started  Impression: Seizure disorder with breakthrough seizure  Recommendations  Continue Keppra 500 twice daily MRI brain unremarkable for acute intracranial process or seizure focus, EEG normal.. Maintain seizure precautions as below.  No driving for 6 months seizure-free explained and patient and family verbalized understanding. Plan was discussed in detail with the patient, her aunt Lindie over FaceTime and her grand mother at bedside.  Needs outpatient follow-up with neurology in 8 to 12 weeks.  Plan discussed with Dr. Trudy ______________________________________________________________________   Signed, Eligio Lav, MD [facility]  SEIZURE PRECAUTIONS Per [place]  [place] statutes, patients with seizures are not allowed to drive until they have been seizure-free for six months.   Use caution when using heavy equipment or power tools. Avoid working on ladders or at heights. Take showers instead of baths. Ensure the water temperature is not too high on the home water heater. Do not go swimming alone. Do not lock yourself in a room alone (i.e. bathroom). When caring for infants or small children, sit down when holding, feeding, or changing them to minimize risk of injury to the child in the event you have a seizure. Maintain good sleep hygiene. Avoid alcohol.    If patient has another seizure, call 911 and bring them back to the ED if: A.  The seizure lasts longer than 5 minutes.      B.  The patient doesn't wake shortly after the seizure or has new problems such as difficulty seeing, speaking or moving following the seizure C.  The patient was injured during the seizure D.  The patient has a temperature over 102 F (39C) E.  The patient vomited during the seizure and now is having trouble breathing

## 2024-08-23 NOTE — Progress Notes (Signed)
..    Brief Assessment Note  Patient admitted with n/v s/p witnessed seizure. List of PCPs were added to the patient's AVS. Medical record reviewed and patient has no  other TOC needs at this time. Please outreach to [facility] if needs are identified.     Patient Goals and CMS Choice        Expected Discharge Plan and Services                                                Prior Living Arrangements/Services                       Activities of Daily Living   ADL Screening (condition at time of admission) Independently performs ADLs?: Yes (appropriate for developmental age) Is the patient deaf or have difficulty hearing?: No Does the patient have difficulty seeing, even when wearing glasses/contacts?: No Does the patient have difficulty concentrating, remembering, or making decisions?: No  Permission Sought/Granted                  Emotional Assessment              Admission diagnosis:  Dehydration [E86.0] Seizure (HCC) [R56.9] Acute UTI [N39.0] Nausea and vomiting in adult [R11.2] Seizure-like activity [facility]) [R56.9] Patient Active Problem List   Diagnosis Date Noted   Seizure (HCC) 08/22/2024   PCP:  Patient, No Pcp Per Pharmacy:   [id number] - [place], [place] - [address] [place] [address] Phone: [phone] Fax: [phone]     Social Determinants of Health (SDOH) Interventions    Readmission Risk Interventions     No data to display

## 2024-10-02 ENCOUNTER — Ambulatory Visit: Admitting: Physician Assistant

## 2024-10-02 DIAGNOSIS — N898 Other specified noninflammatory disorders of vagina: Secondary | ICD-10-CM

## 2024-10-02 DIAGNOSIS — R569 Unspecified convulsions: Secondary | ICD-10-CM

## 2024-10-02 DIAGNOSIS — Z7689 Persons encountering health services in other specified circumstances: Secondary | ICD-10-CM

## 2024-10-06 ENCOUNTER — Other Ambulatory Visit: Payer: Self-pay

## 2024-10-06 ENCOUNTER — Emergency Department
Admission: EM | Admit: 2024-10-06 | Discharge: 2024-10-06 | Disposition: A | Discharge status: Home / Self Care | Attending: Emergency Medicine | Admitting: Emergency Medicine

## 2024-10-06 ENCOUNTER — Encounter: Payer: Self-pay | Admitting: Intensive Care

## 2024-10-06 DIAGNOSIS — R569 Unspecified convulsions: Secondary | ICD-10-CM | POA: Insufficient documentation

## 2024-10-06 DIAGNOSIS — Z91148 Patient's other noncompliance with medication regimen for other reason: Secondary | ICD-10-CM | POA: Diagnosis not present

## 2024-10-06 HISTORY — DX: Unspecified convulsions: R56.9

## 2024-10-06 LAB — CBC WITH DIFFERENTIAL/PLATELET
Abs Immature Granulocytes: 0.04 K/uL (ref 0.00–0.07)
Basophils Absolute: 0 K/uL (ref 0.0–0.1)
Basophils Relative: 0 %
Eosinophils Absolute: 0 K/uL (ref 0.0–0.5)
Eosinophils Relative: 0 %
HCT: 41.3 % (ref 36.0–46.0)
Hemoglobin: 12.7 g/dL (ref 12.0–15.0)
Immature Granulocytes: 1 %
Lymphocytes Relative: 17 %
Lymphs Abs: 1.2 K/uL (ref 0.7–4.0)
MCH: 26.8 pg (ref 26.0–34.0)
MCHC: 30.8 g/dL (ref 30.0–36.0)
MCV: 87.3 fL (ref 80.0–100.0)
Monocytes Absolute: 0.2 K/uL (ref 0.1–1.0)
Monocytes Relative: 3 %
Neutro Abs: 5.3 K/uL (ref 1.7–7.7)
Neutrophils Relative %: 79 %
Platelets: 222 K/uL (ref 150–400)
RBC: 4.73 MIL/uL (ref 3.87–5.11)
RDW: 13.7 % (ref 11.5–15.5)
WBC: 6.7 K/uL (ref 4.0–10.5)
nRBC: 0 % (ref 0.0–0.2)

## 2024-10-06 LAB — URINALYSIS, ROUTINE W REFLEX MICROSCOPIC
Bacteria, UA: NONE SEEN
Bilirubin Urine: NEGATIVE
Glucose, UA: NEGATIVE mg/dL
Hgb urine dipstick: NEGATIVE
Ketones, ur: NEGATIVE mg/dL
Leukocytes,Ua: NEGATIVE
Nitrite: NEGATIVE
Protein, ur: 100 mg/dL — AB
Specific Gravity, Urine: 1.02 (ref 1.005–1.030)
pH: 5 (ref 5.0–8.0)

## 2024-10-06 LAB — COMPREHENSIVE METABOLIC PANEL WITH GFR
ALT: 19 U/L (ref 0–44)
AST: 33 U/L (ref 15–41)
Albumin: 5.3 g/dL — ABNORMAL HIGH (ref 3.5–5.0)
Alkaline Phosphatase: 75 U/L (ref 38–126)
Anion gap: 12 (ref 5–15)
BUN: 8 mg/dL (ref 6–20)
CO2: 21 mmol/L — ABNORMAL LOW (ref 22–32)
Calcium: 9.9 mg/dL (ref 8.9–10.3)
Chloride: 105 mmol/L (ref 98–111)
Creatinine, Ser: 0.68 mg/dL (ref 0.44–1.00)
GFR, Estimated: >60 mL/min (ref >60)
Glucose, Bld: 135 mg/dL — ABNORMAL HIGH (ref 70–99)
Potassium: 4.5 mmol/L (ref 3.5–5.1)
Sodium: 138 mmol/L (ref 135–145)
Total Bilirubin: 0.9 mg/dL (ref 0.0–1.2)
Total Protein: 8.3 g/dL — ABNORMAL HIGH (ref 6.5–8.1)

## 2024-10-06 LAB — PREGNANCY, URINE: Preg Test, Ur: NEGATIVE

## 2024-10-06 MED ORDER — SODIUM CHLORIDE 0.9 % IV BOLUS
1000.0000 mL | Freq: Once | INTRAVENOUS | Status: AC
  Administered 2024-10-06: 1000 mL via INTRAVENOUS

## 2024-10-06 MED ORDER — LEVETIRACETAM (KEPPRA) 500 MG/5 ML ADULT IV PUSH
1000.0000 mg | Freq: Once | INTRAVENOUS | Status: AC
  Administered 2024-10-06: 1000 mg via INTRAVENOUS
  Filled 2024-10-06: qty 10

## 2024-10-06 MED ORDER — LEVETIRACETAM 500 MG PO TABS: 500.0000 mg | ORAL_TABLET | Freq: Two times a day (BID) | ORAL | 0 refills | Status: AC

## 2024-10-06 MED ORDER — ONDANSETRON HCL 4 MG/2ML IJ SOLN
4.0000 mg | Freq: Once | INTRAMUSCULAR | Status: AC
  Administered 2024-10-06: 4 mg via INTRAVENOUS
  Filled 2024-10-06: qty 2

## 2024-10-06 NOTE — ED Notes (Signed)
 This NT assisted pt to toilet and back to bed. Pt able to walk with very little assistance. Pt also provided a urine sample. Pt is now resting comfortably in bed.

## 2024-10-06 NOTE — Discharge Instructions (Addendum)
 Start taking your Keppra that you can take your next dose tonight.  Is important that you do not miss medications as this can lead to recurrent seizures.  Return to the ER if you develop any new symptoms such as fevers, severe headaches or any other concerns Please call the neurology to make a follow-up appointment.   Per [place]  DMV statutes, patients with seizures are not allowed to drive until  they have been seizure-free for six months. Use caution when using heavy equipment or power tools. Avoid working on ladders or at heights. Take showers instead of baths. Ensure the water temperature is not too high on the home water heater. Do not go swimming alone. When caring for infants or small children, sit down when holding, feeding, or changing them to minimize risk of injury to the child in the event you have a seizure.   Also, Maintain good sleep hygiene. Avoid alcohol.

## 2024-10-06 NOTE — ED Notes (Signed)
Seizure Pads placed

## 2024-10-06 NOTE — ED Provider Notes (Signed)
 [facility] Provider Note    Event Date/Time   First MD Initiated Contact with Patient 10/06/24 1015     (approximate)   History   Seizures   HPI  Tammy Cross is a 18 y.o. female with IBS, seizures who comes in with concerns for seizure.  I reviewed the note where patient was admitted from 10/1 until 10/2.  Patient was started on Keppra.  She had had a normal EEG back in May 2025.  The plan was to follow-up with neurology in 8-12 weeks as per neuro.  She had intractable nausea vomiting abdominal pain but CT abdomen was negative.  She did have positive marijuana use.  Patient is feeling better found her seizing in the bed and lasted 10 minutes this morning.  She was nauseous and heaving in triage and there is report of being noncompliant with her medications.  Patient's grandma and boyfriend are at bedside.  He reports that he witnessed the seizure activity this morning she did not fall out of bed did not hit her head.  He reports that it lasted a few minutes and then stopped and then they drove her to the emergency room.  She was in the back of the car laying down when she then had come back to her normal self.  However she then had another seizure that lasted a few minutes.  It was shorter than the second time and afterwards she has had nausea, vomiting.  Denies any abdominal pain.  Denies any tongue biting.  She reports being noncompliant with her Keppra.  Physical Exam   Triage Vital Signs: ED Triage Vitals  Encounter Vitals Group     BP 10/06/24 1008 (!) 126/100     Girls Systolic BP Percentile --      Girls Diastolic BP Percentile --      Boys Systolic BP Percentile --      Boys Diastolic BP Percentile --      Pulse Rate 10/06/24 1008 92     Resp 10/06/24 1008 18     Temp 10/06/24 1008 98.2 F (36.8 C)     Temp Source 10/06/24 1008 Oral     SpO2 10/06/24 1008 100 %     Weight 10/06/24 1011 98 lb (44.5 kg)     Height 10/06/24 1011 4' 11 (1.499 m)      Head Circumference --      Peak Flow --      Pain Score 10/06/24 1010 6     Pain Loc --      Pain Education --      Exclude from Growth Chart --     Most recent vital signs: Vitals:   10/06/24 1008  BP: (!) 126/100  Pulse: 92  Resp: 18  Temp: 98.2 F (36.8 C)  SpO2: 100%     General: Awake, no distress.  CV:  Good peripheral perfusion.  Resp:  Normal effort.  Abd:  No distention.  Soft and nontender Other:  Cranial nerves are intact equal strength in arms and legs.   ED Results / Procedures / Treatments   Labs (all labs ordered are listed, but only abnormal results are displayed) Labs Reviewed  COMPREHENSIVE METABOLIC PANEL WITH GFR  CBC WITH DIFFERENTIAL/PLATELET  URINALYSIS, ROUTINE W REFLEX MICROSCOPIC  LEVETIRACETAM LEVEL  PREGNANCY, URINE  CBG MONITORING, ED     EKG  My interpretation of EKG:  Normal sinus rhythm 93 without any ST elevation or T wave inversions,  normal intervals  RADIOLOGY I have reviewed the xray personally and interpreted    Patient had MRI with and without on 08/22/2024 without any acute intracranial abnormality she had a CT head that was also negative on this day.  PROCEDURES:  Critical Care performed: No  .1-3 Lead EKG Interpretation  Performed by: Ernest Ronal BRAVO, MD Authorized by: Ernest Ronal BRAVO, MD     Interpretation: normal     ECG rate:  90   ECG rate assessment: normal     Rhythm: sinus rhythm     Ectopy: none     Conduction: normal      MEDICATIONS ORDERED IN ED: Medications  sodium chloride  0.9 % bolus 1,000 mL (1,000 mLs Intravenous New Bag/Given 10/06/24 1055)  ondansetron  (ZOFRAN ) injection 4 mg (4 mg Intravenous Given 10/06/24 1055)  levETIRAcetam (KEPPRA) undiluted injection 1,000 mg (1,000 mg Intravenous Given 10/06/24 1055)     IMPRESSION / MDM / ASSESSMENT AND PLAN / ED COURSE  I reviewed the triage vital signs and the nursing notes.   Patient's presentation is most consistent with acute  presentation with potential threat to life or bodily function.   Patient comes in with breakthrough seizure.  Suspect related to noncompliance to Keppra.  Workup was done to evaluate for Electra abnormalities, AKI, UTI, pregnancy.  However patient denies any head trauma and open the nausea and vomiting is acting at her baseline self.  Will give some fluids, Zofran .  Abdomen soft and nontender and she had CT imaging previously that was reassuring.   CBC is reassuring with normal white count.  CMP is reassuring.  Urine with some protein in it but no evidence of UTI.  Negative pregnancy test.  Glucose was 135  11:56 AM reevaluated patient.  Now that she is not actively vomiting unable to do a better oral exam she does have an abrasion on the side of her tongue but no laceration.  She has no trauma to the head noted no C-spine tenderness and full range of motion of neck.  She denies any other pain.  Abdomen is soft and nontender.  She has had resolution of nausea, vomiting.  She reports feeling much improved and feeling at her baseline self other than feeling weak.  She still getting her fluid. She does report a another prescription of her Keppra.  She other wise denies any concerns and does report not having taken her Keppra for the past few days.  She does feel comfortable with discharge home and understands the importance of Keppra compliance and understands not to drive or work with heavy machinery until cleared by neurology.  She expressed understanding and felt comfortable with discharge home after fluid.   The patient is on the cardiac monitor to evaluate for evidence of arrhythmia and/or significant heart rate changes.      FINAL CLINICAL IMPRESSION(S) / ED DIAGNOSES   Final diagnoses:  Seizure (HCC)     Rx / DC Orders   ED Discharge Orders     None        Note:  This document was prepared using Dragon voice recognition software and may include unintentional dictation errors.    Ernest Ronal BRAVO, MD 10/06/24 [phone]

## 2024-10-06 NOTE — ED Triage Notes (Signed)
 Patient presents with boyfriend and mom who report patient had seizure in bed that lasted 10 minutes this AM. Boyfriend reports patient was shaking and foaming at the mouth. Patient nauseas and heaving in triage.  Reports she is non compliant with her seizure medication

## 2024-10-06 NOTE — ED Notes (Signed)
POC - Negative 

## 2024-10-08 LAB — LEVETIRACETAM LEVEL: Levetiracetam Lvl: <2 ug/mL — ABNORMAL LOW (ref 10.0–40.0)

## 2024-10-23 ENCOUNTER — Ambulatory Visit: Admitting: Obstetrics and Gynecology

## 2024-11-16 ENCOUNTER — Ambulatory Visit: Admitting: Certified Nurse Midwife

## 2024-11-16 NOTE — Progress Notes (Deleted)
" ° °  ANNUAL EXAM Patient name: Tammy Cross MRN [id number]  Date of birth: 2005/12/09 Chief Complaint:   No chief complaint on file.  History of Present Illness:   Tammy Cross is a 18 y.o. G0P0000 {race:25618} female being seen today for a routine annual exam.  Current complaints: ***  No LMP recorded. Patient has had an implant.       The pregnancy intention screening data noted above was reviewed. Potential methods of contraception were discussed. The patient elected to proceed with No data recorded.   No results found for: DIAGPAP, HPVHIGH, ADEQPAP    Last pap NA.  Last mammogram: NA Last colonoscopy: NA      No data to display               No data to display            Past Medical History:  Diagnosis Date   Seizures (HCC)     No family history on file. Review of Systems:   Pertinent items are noted in HPI Denies any headaches, blurred vision, fatigue, shortness of breath, chest pain, abdominal pain, abnormal vaginal discharge/itching/odor/irritation, problems with periods, bowel movements, urination, or intercourse unless otherwise stated above. Pertinent History Reviewed:  Reviewed past medical,surgical, social and family history.  Reviewed problem list, medications and allergies. Physical Assessment:  There were no vitals filed for this visit.There is no height or weight on file to calculate BMI.       Physical Exam   No results found for this or any previous visit (from the past 24 hours).  Assessment & Plan:  There are no diagnoses linked to this encounter.  Mammogram: {Mammo f/u:25212::@ 18yo}, or sooner if problems Colonoscopy: {TCS f/u:25213::@ 18yo}, or sooner if problems  No orders of the defined types were placed in this encounter.   Meds: No orders of the defined types were placed in this encounter.   Follow-up: No follow-ups on file.  Rollo FORBES Louder, RN 11/16/2024 10:37 AM  "
